# Patient Record
Sex: Female | Born: 1955 | Race: White | Hispanic: No | Smoking: Former smoker
Health system: Southern US, Community
[De-identification: ages and names within clinical notes are randomized; demographics above are authoritative.]

## PROBLEM LIST (undated history)

## (undated) DIAGNOSIS — L4050 Arthropathic psoriasis, unspecified: Secondary | ICD-10-CM

## (undated) DIAGNOSIS — T7840XA Allergy, unspecified, initial encounter: Secondary | ICD-10-CM

## (undated) DIAGNOSIS — M1990 Unspecified osteoarthritis, unspecified site: Secondary | ICD-10-CM

## (undated) DIAGNOSIS — T07XXXA Unspecified multiple injuries, initial encounter: Secondary | ICD-10-CM

## (undated) DIAGNOSIS — S82892A Other fracture of left lower leg, initial encounter for closed fracture: Secondary | ICD-10-CM

## (undated) HISTORY — PX: REDUCTION MAMMAPLASTY: SUR839

## (undated) HISTORY — DX: Unspecified osteoarthritis, unspecified site: M19.90

## (undated) HISTORY — DX: Allergy, unspecified, initial encounter: T78.40XA

## (undated) HISTORY — DX: Unspecified multiple injuries, initial encounter: T07.XXXA

## (undated) HISTORY — DX: Arthropathic psoriasis, unspecified: L40.50

## (undated) HISTORY — PX: CHOLECYSTECTOMY: SHX55

## (undated) HISTORY — PX: ABDOMINAL HERNIA REPAIR: SHX539

## (undated) HISTORY — PX: ABDOMINAL HYSTERECTOMY: SHX81

## (undated) HISTORY — PX: HYSTERECTOMY ABDOMINAL WITH SALPINGECTOMY: SHX6725

---

## 1988-07-29 HISTORY — PX: REDUCTION MAMMAPLASTY: SUR839

## 2003-07-30 HISTORY — PX: BREAST BIOPSY: SHX20

## 2016-07-29 DIAGNOSIS — S82892A Other fracture of left lower leg, initial encounter for closed fracture: Secondary | ICD-10-CM

## 2016-07-29 HISTORY — DX: Other fracture of left lower leg, initial encounter for closed fracture: S82.892A

## 2017-04-01 ENCOUNTER — Telehealth: Payer: Self-pay | Admitting: Orthopaedic Surgery

## 2017-04-01 NOTE — Telephone Encounter (Signed)
Patient called earlier today, 04/01/17, to request appointment for as soon as possible, for problem of "fracture of distal fibula", and relays [facility] [place], [place], near [place]. Discussed the need for films and reports. States has copy of films (CD). States [phone] / FAX [phone]. States was told if our office faxed a request for the reports on our letterhead, marked "Urgent", they would send.  Faxed accordingly.   Patient also relayed she is self-pay; aware of minimum payment and self-pay protocol.  States "no problem" -  Appointment pending.

## 2017-04-02 ENCOUNTER — Encounter: Payer: Self-pay | Admitting: Orthopaedic Surgery

## 2017-04-02 ENCOUNTER — Ambulatory Visit (INDEPENDENT_AMBULATORY_CARE_PROVIDER_SITE_OTHER): Payer: Self-pay | Admitting: Orthopaedic Surgery

## 2017-04-02 ENCOUNTER — Ambulatory Visit (INDEPENDENT_AMBULATORY_CARE_PROVIDER_SITE_OTHER): Payer: Self-pay

## 2017-04-02 VITALS — BP 115/79 | HR 72 | Temp 98.2°F | Ht 64.0 in

## 2017-04-02 DIAGNOSIS — S8262XA Displaced fracture of lateral malleolus of left fibula, initial encounter for closed fracture: Secondary | ICD-10-CM

## 2017-04-02 NOTE — Telephone Encounter (Signed)
Called back to patient this morning to notify that records and reports have been received.  Appointment scheduled for today; aware.

## 2017-04-02 NOTE — Progress Notes (Signed)
Subjective:    Patient ID: Heather Willis, female    DOB: 1955/11/28, 61 y.o.   MRN: [id number]  HPI She took a misstep while in [place] early Saturday morning while coming down the steps.  She hurt her left ankle.  She went to the ER there and had X-rays.  They report fracture distal fibula left with some displacement.  I do not have CD or films to review.  She had no other injury. She was put in posterior splint and given crutches.  She is now back in town and seeking treatment.  Review of Systems  HENT: Negative for congestion.   Respiratory: Negative for cough and shortness of breath.   Cardiovascular: Negative for chest pain and leg swelling.  Endocrine: Negative for cold intolerance.  Musculoskeletal: Positive for arthralgias, gait problem and joint swelling.  Allergic/Immunologic: Negative for environmental allergies.   Past Medical History:  Diagnosis Date  . Arthritis   . Fractures     History reviewed. No pertinent surgical history.  No current outpatient prescriptions on file prior to visit.   No current facility-administered medications on file prior to visit.     Social History   Social History  . Marital status: Single    Spouse name: N/A  . Number of children: N/A  . Years of education: N/A   Occupational History  . Not on file.   Social History Main Topics  . Smoking status: Never Smoker  . Smokeless tobacco: Never Used  . Alcohol use Not on file  . Drug use: Unknown  . Sexual activity: Not on file   Other Topics Concern  . Not on file   Social History Narrative  . No narrative on file    Family History  Problem Relation Age of Onset  . Diabetes Sister     BP 115/79   Pulse 72   Temp 98.2 F (36.8 C)   Ht 5\' 4"  (1.626 m)       Objective:   Physical Exam  Constitutional: She is oriented to person, place, and time. She appears well-developed and well-nourished.  HENT:  Head: Normocephalic and atraumatic.  Eyes: Pupils are equal,  round, and reactive to light. Conjunctivae and EOM are normal.  Neck: Normal range of motion. Neck supple.  Cardiovascular: Normal rate, regular rhythm and intact distal pulses.   Pulmonary/Chest: Effort normal.  Abdominal: Soft.  Musculoskeletal: She exhibits tenderness (Left ankle with swelling and ecchymosis, swelling medial and lateral with more lateral pain.  NV intact. ROM decreased.  right ankle negative.).  Neurological: She is alert and oriented to person, place, and time. She displays normal reflexes. No cranial nerve deficit. She exhibits normal muscle tone. Coordination normal.  Skin: Skin is warm and dry.  Psychiatric: She has a normal mood and affect. Her behavior is normal. Judgment and thought content normal.  Vitals reviewed.    X-rays were done and reported separately.     Assessment & Plan:   Encounter Diagnosis  Name Primary?  . Closed displaced fracture of lateral malleolus of left fibula, initial encounter Yes   I have explained the findings of the X-rays and the significance of medial mortise widening.  I will have her seen at [facility] for possible consideration of surgery.  Dr. Romeo Apple is on vacation.    Posterior splint applied.  Continue the crutches, elevation and ice.  Continue Tylenol and Advil.  Call if any problem.  Precautions discussed.   Electronically Signed Darreld Mclean, MD  9/5/20182:57 PM

## 2017-04-07 ENCOUNTER — Encounter (INDEPENDENT_AMBULATORY_CARE_PROVIDER_SITE_OTHER): Payer: Self-pay | Admitting: Orthopaedic Surgery

## 2017-04-07 ENCOUNTER — Ambulatory Visit (INDEPENDENT_AMBULATORY_CARE_PROVIDER_SITE_OTHER): Payer: Self-pay | Admitting: Orthopaedic Surgery

## 2017-04-07 ENCOUNTER — Other Ambulatory Visit (INDEPENDENT_AMBULATORY_CARE_PROVIDER_SITE_OTHER): Payer: Self-pay | Admitting: Orthopaedic Surgery

## 2017-04-07 DIAGNOSIS — S8262XA Displaced fracture of lateral malleolus of left fibula, initial encounter for closed fracture: Secondary | ICD-10-CM | POA: Insufficient documentation

## 2017-04-07 HISTORY — DX: Displaced fracture of lateral malleolus of left fibula, initial encounter for closed fracture: S82.62XA

## 2017-04-07 NOTE — Progress Notes (Signed)
   Office Visit Note   Patient: Heather Willis           Date of Birth: 03/10/1956           MRN: [id number] Visit Date: 04/07/2017              Requested by: No referring provider defined for this encounter. PCP: Patient, No Pcp Per   Assessment & Plan: Visit Diagnoses:  1. Closed displaced fracture of lateral malleolus of left fibula, initial encounter     Plan: Patient has displaced lateral malleolus fracture with widening of medial clear space. Patient's injury is indicated for operative fixation. We discussed the risks benefits alternatives surgery and wished to proceed. We will plan on surgery this week. Questions encouraged and answered. Cam Walker  Follow-Up Instructions: Return for 2 week postop visit.   Orders:  No orders of the defined types were placed in this encounter.  No orders of the defined types were placed in this encounter.     Procedures: No procedures performed   Clinical Data: No additional findings.   Subjective: Chief Complaint  Patient presents with  . Left Ankle - Pain, New Patient (Initial Visit)    Patient is a healthy 61 year old female who had a mechanical about 10 days in [place]. She is referred here for further evaluation and treatment surgical evaluation. She denies any numbness and tingling. She does have swelling and bruising. Pain is improved.    Review of Systems  Constitutional: Negative.   HENT: Negative.   Eyes: Negative.   Respiratory: Negative.   Cardiovascular: Negative.   Endocrine: Negative.   Musculoskeletal: Negative.   Neurological: Negative.   Hematological: Negative.   Psychiatric/Behavioral: Negative.   All other systems reviewed and are negative.    Objective: Vital Signs: There were no vitals taken for this visit.  Physical Exam  Constitutional: She is oriented to person, place, and time. She appears well-developed and well-nourished.  HENT:  Head: Normocephalic and atraumatic.  Eyes: EOM are  normal.  Neck: Neck supple.  Pulmonary/Chest: Effort normal.  Abdominal: Soft.  Neurological: She is alert and oriented to person, place, and time.  Skin: Skin is warm. Capillary refill takes less than 2 seconds.  Psychiatric: She has a normal mood and affect. Her behavior is normal. Judgment and thought content normal.  Nursing note and vitals reviewed.   Ortho Exam Left ankle exam shows swelling and bruising as expected from the injury. The foot is warm well-perfused. Specialty Comments:  No specialty comments available.  Imaging: No results found.   PMFS History: Patient Active Problem List   Diagnosis Date Noted  . Fracture of left ankle, lateral malleolus 04/07/2017   Past Medical History:  Diagnosis Date  . Arthritis   . Fractures     Family History  Problem Relation Age of Onset  . Diabetes Sister     No past surgical history on file. Social History   Occupational History  . Not on file.   Social History Main Topics  . Smoking status: Never Smoker  . Smokeless tobacco: Never Used  . Alcohol use Not on file  . Drug use: Unknown  . Sexual activity: Not on file

## 2017-04-08 ENCOUNTER — Encounter (HOSPITAL_BASED_OUTPATIENT_CLINIC_OR_DEPARTMENT_OTHER): Payer: Self-pay | Admitting: *Deleted

## 2017-04-09 ENCOUNTER — Ambulatory Visit (HOSPITAL_BASED_OUTPATIENT_CLINIC_OR_DEPARTMENT_OTHER): Payer: Self-pay | Admitting: Anesthesiology

## 2017-04-09 ENCOUNTER — Encounter (HOSPITAL_BASED_OUTPATIENT_CLINIC_OR_DEPARTMENT_OTHER): Payer: Self-pay | Admitting: Emergency Medicine

## 2017-04-09 ENCOUNTER — Ambulatory Visit (HOSPITAL_COMMUNITY): Payer: Self-pay

## 2017-04-09 ENCOUNTER — Encounter (HOSPITAL_BASED_OUTPATIENT_CLINIC_OR_DEPARTMENT_OTHER)
Admission: RE | Disposition: A | Discharge status: Home / Self Care | Payer: Self-pay | Source: Ambulatory Visit | Attending: Orthopaedic Surgery

## 2017-04-09 ENCOUNTER — Ambulatory Visit (HOSPITAL_BASED_OUTPATIENT_CLINIC_OR_DEPARTMENT_OTHER)
Admission: RE | Admit: 2017-04-09 | Discharge: 2017-04-09 | Disposition: A | Discharge status: Home / Self Care | Payer: Self-pay | Source: Ambulatory Visit | Attending: Orthopaedic Surgery | Admitting: Orthopaedic Surgery

## 2017-04-09 DIAGNOSIS — S8262XA Displaced fracture of lateral malleolus of left fibula, initial encounter for closed fracture: Secondary | ICD-10-CM

## 2017-04-09 DIAGNOSIS — Z6836 Body mass index (BMI) 36.0-36.9, adult: Secondary | ICD-10-CM | POA: Insufficient documentation

## 2017-04-09 DIAGNOSIS — Z419 Encounter for procedure for purposes other than remedying health state, unspecified: Secondary | ICD-10-CM

## 2017-04-09 DIAGNOSIS — X58XXXA Exposure to other specified factors, initial encounter: Secondary | ICD-10-CM | POA: Insufficient documentation

## 2017-04-09 DIAGNOSIS — F17200 Nicotine dependence, unspecified, uncomplicated: Secondary | ICD-10-CM | POA: Insufficient documentation

## 2017-04-09 HISTORY — DX: Other fracture of left lower leg, initial encounter for closed fracture: S82.892A

## 2017-04-09 HISTORY — PX: ORIF ANKLE FRACTURE: SHX5408

## 2017-04-09 SURGERY — OPEN REDUCTION INTERNAL FIXATION (ORIF) ANKLE FRACTURE
Anesthesia: General | Site: Ankle | Laterality: Left

## 2017-04-09 MED ORDER — CLINDAMYCIN PHOSPHATE 900 MG/50ML IV SOLN
INTRAVENOUS | Status: AC
Start: 2017-04-09 — End: ?
  Filled 2017-04-09: qty 50

## 2017-04-09 MED ORDER — CLINDAMYCIN PHOSPHATE 900 MG/50ML IV SOLN
900.0000 mg | INTRAVENOUS | Status: AC
  Administered 2017-04-09: 900 mg via INTRAVENOUS

## 2017-04-09 MED ORDER — ZINC SULFATE 220 (50 ZN) MG PO CAPS: 220.0000 mg | ORAL_CAPSULE | Freq: Every day | ORAL | 0 refills | Status: DC

## 2017-04-09 MED ORDER — DEXAMETHASONE SODIUM PHOSPHATE 4 MG/ML IJ SOLN
INTRAMUSCULAR | Status: DC | PRN
  Administered 2017-04-09: 10 mg via INTRAVENOUS

## 2017-04-09 MED ORDER — MIDAZOLAM HCL 2 MG/2ML IJ SOLN: 0.5000 mg | Freq: Once | INTRAMUSCULAR | Status: DC | PRN

## 2017-04-09 MED ORDER — FENTANYL CITRATE (PF) 100 MCG/2ML IJ SOLN
INTRAMUSCULAR | Status: AC
  Filled 2017-04-09: qty 2

## 2017-04-09 MED ORDER — PROMETHAZINE HCL 25 MG/ML IJ SOLN: 6.2500 mg | INTRAMUSCULAR | Status: DC | PRN

## 2017-04-09 MED ORDER — SCOPOLAMINE 1 MG/3DAYS TD PT72: 1.0000 | MEDICATED_PATCH | Freq: Once | TRANSDERMAL | Status: DC | PRN

## 2017-04-09 MED ORDER — CLINDAMYCIN PHOSPHATE 900 MG/50ML IV SOLN
INTRAVENOUS | Status: AC
  Filled 2017-04-09: qty 50

## 2017-04-09 MED ORDER — FENTANYL CITRATE (PF) 100 MCG/2ML IJ SOLN
50.0000 ug | INTRAMUSCULAR | Status: AC | PRN
  Administered 2017-04-09: 25 ug via INTRAVENOUS
  Administered 2017-04-09: 50 ug via INTRAVENOUS
  Administered 2017-04-09: 25 ug via INTRAVENOUS
  Administered 2017-04-09: 100 ug via INTRAVENOUS

## 2017-04-09 MED ORDER — ONDANSETRON HCL 4 MG/2ML IJ SOLN
INTRAMUSCULAR | Status: DC | PRN
  Administered 2017-04-09: 4 mg via INTRAVENOUS

## 2017-04-09 MED ORDER — OXYCODONE-ACETAMINOPHEN 5-325 MG PO TABS: 1.0000 | ORAL_TABLET | ORAL | 0 refills | Status: DC | PRN

## 2017-04-09 MED ORDER — BUPIVACAINE-EPINEPHRINE (PF) 0.5% -1:200000 IJ SOLN
INTRAMUSCULAR | Status: DC | PRN
  Administered 2017-04-09 (×2): 25 mL via PERINEURAL

## 2017-04-09 MED ORDER — LIDOCAINE 2% (20 MG/ML) 5 ML SYRINGE
INTRAMUSCULAR | Status: DC | PRN
Start: 2017-04-09 — End: 2017-04-09
  Administered 2017-04-09: 25 mg via INTRAVENOUS

## 2017-04-09 MED ORDER — MEPERIDINE HCL 25 MG/ML IJ SOLN: 6.2500 mg | INTRAMUSCULAR | Status: DC | PRN

## 2017-04-09 MED ORDER — PROPOFOL 10 MG/ML IV BOLUS
INTRAVENOUS | Status: DC | PRN
  Administered 2017-04-09: 200 mg via INTRAVENOUS

## 2017-04-09 MED ORDER — METHOCARBAMOL 750 MG PO TABS: 750.0000 mg | ORAL_TABLET | Freq: Two times a day (BID) | ORAL | 0 refills | Status: DC | PRN

## 2017-04-09 MED ORDER — ONDANSETRON HCL 4 MG PO TABS: 4.0000 mg | ORAL_TABLET | Freq: Three times a day (TID) | ORAL | 0 refills | Status: DC | PRN

## 2017-04-09 MED ORDER — MIDAZOLAM HCL 2 MG/2ML IJ SOLN
1.0000 mg | INTRAMUSCULAR | Status: DC | PRN
  Administered 2017-04-09: 2 mg via INTRAVENOUS

## 2017-04-09 MED ORDER — ASPIRIN EC 325 MG PO TBEC: 325.0000 mg | DELAYED_RELEASE_TABLET | Freq: Two times a day (BID) | ORAL | 0 refills | Status: DC

## 2017-04-09 MED ORDER — ONDANSETRON HCL 4 MG/2ML IJ SOLN
INTRAMUSCULAR | Status: AC
  Filled 2017-04-09: qty 2

## 2017-04-09 MED ORDER — LACTATED RINGERS IV SOLN
INTRAVENOUS | Status: DC
  Administered 2017-04-09: 09:00:00 via INTRAVENOUS

## 2017-04-09 MED ORDER — MIDAZOLAM HCL 2 MG/2ML IJ SOLN
INTRAMUSCULAR | Status: AC
  Filled 2017-04-09: qty 2

## 2017-04-09 MED ORDER — LIDOCAINE 2% (20 MG/ML) 5 ML SYRINGE
INTRAMUSCULAR | Status: AC
  Filled 2017-04-09: qty 5

## 2017-04-09 MED ORDER — SENNOSIDES-DOCUSATE SODIUM 8.6-50 MG PO TABS: 1.0000 | ORAL_TABLET | Freq: Every evening | ORAL | 1 refills | Status: DC | PRN

## 2017-04-09 MED ORDER — HYDROMORPHONE HCL 1 MG/ML IJ SOLN: 0.2500 mg | INTRAMUSCULAR | Status: DC | PRN

## 2017-04-09 MED ORDER — DEXAMETHASONE SODIUM PHOSPHATE 10 MG/ML IJ SOLN
INTRAMUSCULAR | Status: AC
  Filled 2017-04-09: qty 1

## 2017-04-09 MED ORDER — CALCIUM CARBONATE-VITAMIN D 500-200 MG-UNIT PO TABS
1.0000 | ORAL_TABLET | Freq: Three times a day (TID) | ORAL | 12 refills | Status: DC
Start: 2017-04-09 — End: 2022-01-24

## 2017-04-09 SURGICAL SUPPLY — 83 items
BANDAGE ACE 4X5 VEL STRL LF (GAUZE/BANDAGES/DRESSINGS) ×2 IMPLANT
BANDAGE ACE 6X5 VEL STRL LF (GAUZE/BANDAGES/DRESSINGS) ×2 IMPLANT
BANDAGE ESMARK 6X9 LF (GAUZE/BANDAGES/DRESSINGS) ×1 IMPLANT
BLADE HEX COATED 2.75 (ELECTRODE) IMPLANT
BLADE SURG 15 STRL LF DISP TIS (BLADE) ×2 IMPLANT
BLADE SURG 15 STRL SS (BLADE) ×2
BNDG COHESIVE 6X5 TAN STRL LF (GAUZE/BANDAGES/DRESSINGS) ×2 IMPLANT
BNDG ESMARK 6X9 LF (GAUZE/BANDAGES/DRESSINGS) ×2
BRUSH SCRUB EZ PLAIN DRY (MISCELLANEOUS) ×2 IMPLANT
CANISTER SUCT 1200ML W/VALVE (MISCELLANEOUS) ×2 IMPLANT
COVER BACK TABLE 60X90IN (DRAPES) ×2 IMPLANT
COVER MAYO STAND STRL (DRAPES) ×2 IMPLANT
CUFF TOURNIQUET SINGLE 34IN LL (TOURNIQUET CUFF) ×2 IMPLANT
DECANTER SPIKE VIAL GLASS SM (MISCELLANEOUS) IMPLANT
DRAPE C-ARM 42X72 X-RAY (DRAPES) ×2 IMPLANT
DRAPE C-ARMOR (DRAPES) ×2 IMPLANT
DRAPE EXTREMITY T 121X128X90 (DRAPE) ×2 IMPLANT
DRAPE IMP U-DRAPE 54X76 (DRAPES) ×2 IMPLANT
DRAPE SURG 17X23 STRL (DRAPES) ×4 IMPLANT
DRILL 2.6X122MM WL AO SHAFT (BIT) ×2 IMPLANT
DRSG PAD ABDOMINAL 8X10 ST (GAUZE/BANDAGES/DRESSINGS) ×4 IMPLANT
DURAPREP 26ML APPLICATOR (WOUND CARE) ×2 IMPLANT
ELECT REM PT RETURN 9FT ADLT (ELECTROSURGICAL) ×2
ELECTRODE REM PT RTRN 9FT ADLT (ELECTROSURGICAL) ×1 IMPLANT
GAUZE SPONGE 4X4 12PLY STRL (GAUZE/BANDAGES/DRESSINGS) ×2 IMPLANT
GAUZE XEROFORM 1X8 LF (GAUZE/BANDAGES/DRESSINGS) ×2 IMPLANT
GLOVE BIO SURGEON STRL SZ 6.5 (GLOVE) ×4 IMPLANT
GLOVE BIOGEL PI IND STRL 7.0 (GLOVE) ×1 IMPLANT
GLOVE BIOGEL PI IND STRL 8 (GLOVE) ×1 IMPLANT
GLOVE BIOGEL PI INDICATOR 7.0 (GLOVE) ×1
GLOVE BIOGEL PI INDICATOR 8 (GLOVE) ×1
GLOVE SKINSENSE NS SZ7.5 (GLOVE) ×1
GLOVE SKINSENSE STRL SZ7.5 (GLOVE) ×1 IMPLANT
GLOVE SURG SYN 7.5  E (GLOVE) ×1
GLOVE SURG SYN 7.5 E (GLOVE) ×1 IMPLANT
GOWN STRL REIN XL XLG (GOWN DISPOSABLE) ×2 IMPLANT
GOWN STRL REUS W/ TWL LRG LVL3 (GOWN DISPOSABLE) ×1 IMPLANT
GOWN STRL REUS W/TWL LRG LVL3 (GOWN DISPOSABLE) ×1
K-WIRE ORTHOPEDIC 1.4X150L (WIRE) ×2
KWIRE ORTHOPEDIC 1.4X150L (WIRE) ×1 IMPLANT
MANIFOLD NEPTUNE II (INSTRUMENTS) ×2 IMPLANT
NEEDLE HYPO 22GX1.5 SAFETY (NEEDLE) IMPLANT
NS IRRIG 1000ML POUR BTL (IV SOLUTION) ×2 IMPLANT
PACK BASIN DAY SURGERY FS (CUSTOM PROCEDURE TRAY) ×2 IMPLANT
PAD CAST 3X4 CTTN HI CHSV (CAST SUPPLIES) IMPLANT
PAD CAST 4YDX4 CTTN HI CHSV (CAST SUPPLIES) IMPLANT
PADDING CAST COTTON 3X4 STRL (CAST SUPPLIES)
PADDING CAST COTTON 4X4 STRL (CAST SUPPLIES)
PADDING CAST COTTON 6X4 STRL (CAST SUPPLIES) IMPLANT
PADDING CAST SYN 6 (CAST SUPPLIES) ×1
PADDING CAST SYNTHETIC 4 (CAST SUPPLIES) ×1
PADDING CAST SYNTHETIC 4X4 STR (CAST SUPPLIES) ×1 IMPLANT
PADDING CAST SYNTHETIC 6X4 NS (CAST SUPPLIES) ×1 IMPLANT
PENCIL BUTTON HOLSTER BLD 10FT (ELECTRODE) ×2 IMPLANT
PLATE FIBULA 4H (Plate) ×2 IMPLANT
SCREW 3.5X10MM (Screw) ×2 IMPLANT
SCREW BONE 14MMX3.5MM (Screw) ×2 IMPLANT
SCREW BONE 18 (Screw) ×2 IMPLANT
SCREW BONE NON-LCKING 3.5X12MM (Screw) ×2 IMPLANT
SCREW LOCK 3.5X14 (Screw) ×4 IMPLANT
SCREW LOCKING 3.5X18MM (Screw) ×2 IMPLANT
SCREW NONLOCK 22MM (Screw) ×2 IMPLANT
SHEET MEDIUM DRAPE 40X70 STRL (DRAPES) ×2 IMPLANT
SLEEVE SCD COMPRESS KNEE MED (MISCELLANEOUS) ×2 IMPLANT
SPLINT FIBERGLASS 4X30 (CAST SUPPLIES) IMPLANT
SPONGE LAP 18X18 X RAY DECT (DISPOSABLE) ×2 IMPLANT
SUCTION FRAZIER HANDLE 10FR (MISCELLANEOUS)
SUCTION TUBE FRAZIER 10FR DISP (MISCELLANEOUS) IMPLANT
SUT ETHILON 3 0 PS 1 (SUTURE) IMPLANT
SUT VIC AB 0 CT1 27 (SUTURE)
SUT VIC AB 0 CT1 27XBRD ANBCTR (SUTURE) IMPLANT
SUT VIC AB 2-0 CT1 27 (SUTURE) ×1
SUT VIC AB 2-0 CT1 TAPERPNT 27 (SUTURE) ×1 IMPLANT
SUT VIC AB 3-0 SH 27 (SUTURE)
SUT VIC AB 3-0 SH 27X BRD (SUTURE) IMPLANT
SYR BULB 3OZ (MISCELLANEOUS) ×2 IMPLANT
SYR CONTROL 10ML LL (SYRINGE) IMPLANT
TOWEL OR 17X24 6PK STRL BLUE (TOWEL DISPOSABLE) ×2 IMPLANT
TOWEL OR NON WOVEN STRL DISP B (DISPOSABLE) IMPLANT
TRAY DSU PREP LF (CUSTOM PROCEDURE TRAY) ×2 IMPLANT
TUBE CONNECTING 20X1/4 (TUBING) ×2 IMPLANT
UNDERPAD 30X30 (UNDERPADS AND DIAPERS) ×2 IMPLANT
YANKAUER SUCT BULB TIP NO VENT (SUCTIONS) ×2 IMPLANT

## 2017-04-09 NOTE — Anesthesia Procedure Notes (Addendum)
Anesthesia Regional Block: Adductor canal block   Pre-Anesthetic Checklist: ,, timeout performed, Correct Patient, Correct Site, Correct Laterality, Correct Procedure, Correct Position, site marked, Risks and benefits discussed,  Surgical consent,  Pre-op evaluation,  At surgeon's request and post-op pain management  Laterality: Left and Lower  Prep: chloraprep       Needles:  Injection technique: Single-shot  Needle Type: Echogenic Needle     Needle Length: 9cm  Needle Gauge: 21     Additional Needles:   Procedures: ultrasound guided,,,,,,,,  Narrative:  Start time: 04/09/2017 9:12 AM End time: 04/09/2017 9:19 AM Injection made incrementally with aspirations every 5 mL.  Performed by: Personally  Anesthesiologist: Jean RosenthalJACKSON, Verleen Stuckey  Additional Notes: Pt identified in Holding room.  Monitors applied. Working IV access confirmed. Sterile prep L thigh.  #21ga ECHOgenic needle into adductor canal with US guidance, 25cc 0.5% Bupivacaine with 1:200k epi injected incrementally after negative test dose, good spread in canal.  Patient asymptomatic, VSS, no heme aspirated, tolerated well.  Sandford Craze Tenille Morrill, MD

## 2017-04-09 NOTE — Op Note (Signed)
   Date of Surgery: 04/09/2017  INDICATIONS: [name] is a 61 y.o.-year-old female who sustained a left ankle fracture; she was indicated for open reduction and internal fixation due to the displaced nature of the articular fracture and came to the operating room today for this procedure. The patient did consent to the procedure after discussion of the risks and benefits.  PREOPERATIVE DIAGNOSIS: left lateral malleolus ankle fracture  POSTOPERATIVE DIAGNOSIS: Same.  PROCEDURE: Open treatment of left ankle fracture with internal fixation. Lateral malleolar CPT [id number].  SURGEON: N. Glee ArvinMichael Demarkus Willis, M.D.  ASSIST: none.  ANESTHESIA:  general, regional  TOURNIQUET TIME: less than 1 hr  IV FLUIDS AND URINE: See anesthesia.  ESTIMATED BLOOD LOSS: minimal mL.  IMPLANTS: [employer] V[employer]  COMPLICATIONS: None.  DESCRIPTION OF PROCEDURE: The patient was brought to the operating room and placed supine on the operating table.  The patient had been signed prior to the procedure and this was documented. The patient had the anesthesia placed by the anesthesiologist.  A nonsterile tourniquet was placed on the upper thigh.  The prep verification and incision time-outs were performed to confirm that this was the correct patient, site, side and location. The patient had an SCD on the opposite lower extremity. The patient did receive antibiotics prior to the incision and was re-dosed during the procedure as needed at indicated intervals.  The patient had the lower extremity prepped and draped in the standard surgical fashion.  The extremity was exsanguinated using an esmarch bandage and the tourniquet was inflated to 300 mm Hg.  An incision was created over the lateral aspect of the distal fibula. Dissection was carried down to the fascia. The fascia was sharply incised in line with the incision. Subperiosteal elevation was performed. The fracture was exposed. Organized hematoma was removed. Fracture was then  reduced and clamped with a lobster claw. The fracture alignment and reduction were confirmed under x-ray. A precontoured plate was then placed in the lateral aspect of the fibula at the appropriate position. Locking and nonlocking screws were placed through the plate into the fibula using standard AO technique. Stress exam of the ankle showed a stable medial clear space. The wound was then thoroughly irrigated and closed in layer fashion using 2-0 Vicryl, 3-0 nylon. Sterile dressings were applied. Foot was immobilized in a short leg splint. Patient tolerated procedure well and no immediate competitions.  POSTOPERATIVE PLAN: [name] will remain nonweightbearing on this leg for approximately 6 weeks; [name] will return for suture removal in 2 weeks.  He will be immobilized in a short leg splint and then transitioned to a CAM walker at his first follow up appointment.  [name] will receive DVT prophylaxis based on other medications, activity level, and risk ratio of bleeding to thrombosis.  Heather ReelN. Michael Heather Acoff, MD [facility] [phone] 10:42 AM

## 2017-04-09 NOTE — Anesthesia Preprocedure Evaluation (Addendum)
Anesthesia Evaluation  Patient identified by MRN, date of birth, ID band Patient awake    Reviewed: Allergy & Precautions, NPO status , Patient's Chart, lab work & pertinent test results  History of Anesthesia Complications Negative for: history of anesthetic complications  Airway Mallampati: I  TM Distance: >3 FB Neck ROM: Full    Dental  (+) Dental Advisory Given   Pulmonary Current Smoker,    breath sounds clear to auscultation       Cardiovascular negative cardio ROS   Rhythm:Regular Rate:Normal     Neuro/Psych negative neurological ROS     GI/Hepatic negative GI ROS, Neg liver ROS,   Endo/Other  Morbid obesity  Renal/GU negative Renal ROS     Musculoskeletal  (+) Arthritis , Osteoarthritis,    Abdominal (+) + obese,   Peds  Hematology   Anesthesia Other Findings   Reproductive/Obstetrics                            Anesthesia Physical Anesthesia Plan  ASA: II  Anesthesia Plan: General   Post-op Pain Management: GA combined w/ Regional for post-op pain   Induction: Intravenous  PONV Risk Score and Plan: 2 and Ondansetron and Dexamethasone  Airway Management Planned: LMA  Additional Equipment:   Intra-op Plan:   Post-operative Plan:   Informed Consent: I have reviewed the patients History and Physical, chart, labs and discussed the procedure including the risks, benefits and alternatives for the proposed anesthesia with the patient or authorized representative who has indicated his/her understanding and acceptance.   Dental advisory given  Plan Discussed with: CRNA and Surgeon  Anesthesia Plan Comments: (Plan routine monitors, GA- LMA OK, popliteal and adductor canal blocks for post op analgesia)        Anesthesia Quick Evaluation

## 2017-04-09 NOTE — Anesthesia Procedure Notes (Signed)
Anesthesia Regional Block: Popliteal block   Pre-Anesthetic Checklist: ,, timeout performed, Correct Patient, Correct Site, Correct Laterality, Correct Procedure, Correct Position, site marked, Risks and benefits discussed,  Surgical consent,  Pre-op evaluation,  At surgeon's request and post-op pain management  Laterality: Left and Lower  Prep: chloraprep       Needles:  Injection technique: Single-shot     Needle Length: 9cm  Needle Gauge: 21     Additional Needles:   Procedures:, nerve stimulator,,,,,,,   Nerve Stimulator or Paresthesia:  Response: toe dorsiflexion, 0.48 mA, 0.1 ms,  Response: toe abduction, 0.48 mA, 0.1 ms,   Additional Responses:   Narrative:  Start time: 04/09/2017 9:20 AM End time: 04/09/2017 9:24 AM Injection made incrementally with aspirations every 5 mL.  Performed by: Personally  Anesthesiologist: Jean RosenthalJACKSON, Imanie Darrow  Additional Notes: Pt identified in Holding room.  Monitors applied. Working IV access confirmed. Sterile prep L lateral knee.  #21ga PNS to toe abduction and dorsiflexion twitches at 0.6348mA threshold.  25cc 0.5% Bupivacaine with 1:200k epi injected incrementally after negative test dose.  Patient asymptomatic, VSS, no heme aspirated, tolerated well.  Sandford Craze Calise Dunckel, MD

## 2017-04-09 NOTE — H&P (Signed)
    PREOPERATIVE H&P  Chief Complaint: left lateral malleolus fracture  HPI: Heather Willis is a 61 y.o. female who presents for surgical treatment of left lateral malleolus fracture.  She denies any changes in medical history.  Past Medical History:  Diagnosis Date  . Arthritis   . Closed left ankle fracture 2018  . Fractures    Past Surgical History:  Procedure Laterality Date  . ABDOMINAL HERNIA REPAIR     18 yrs ago  . CHOLECYSTECTOMY    . HYSTERECTOMY ABDOMINAL WITH SALPINGECTOMY     at age 61   Social History   Social History  . Marital status: Single    Spouse name: N/A  . Number of children: N/A  . Years of education: N/A   Social History Main Topics  . Smoking status: Current Every Day Smoker  . Smokeless tobacco: Never Used  . Alcohol use No  . Drug use: No  . Sexual activity: Not on file   Other Topics Concern  . Not on file   Social History Narrative  . No narrative on file   Family History  Problem Relation Age of Onset  . Diabetes Sister    Allergies  Allergen Reactions  . Penicillins    Prior to Admission medications   Medication Sig Start Date End Date Taking? Authorizing Provider  acetaminophen (TYLENOL) 500 MG tablet Take 1,000 mg by mouth 2 (two) times daily.   Yes [provider]  diphenhydramine-acetaminophen (TYLENOL PM) 25-500 MG TABS tablet Take 2 tablets by mouth at bedtime as needed.   Yes [provider]  ibuprofen (ADVIL,MOTRIN) 200 MG tablet Take 200 mg by mouth every 6 (six) hours as needed.   Yes [provider]     Positive ROS: All other systems have been reviewed and were otherwise negative with the exception of those mentioned in the HPI and as above.  Physical Exam: General: Alert, no acute distress Cardiovascular: No pedal edema Respiratory: No cyanosis, no use of accessory musculature GI: abdomen soft Skin: No lesions in the area of chief complaint Neurologic: Sensation intact  distally Psychiatric: Patient is competent for consent with normal mood and affect Lymphatic: no lymphedema  MUSCULOSKELETAL: exam stable  Assessment: left lateral malleolus fracture  Plan: Plan for Procedure(s): OPEN REDUCTION INTERNAL FIXATION (ORIF) LEFT ANKLE FRACTURE  The risks benefits and alternatives were discussed with the patient including but not limited to the risks of nonoperative treatment, versus surgical intervention including infection, bleeding, nerve injury,  blood clots, cardiopulmonary complications, morbidity, mortality, among others, and they were willing to proceed.   Glee ArvinMichael Mariacristina Aday, MD   04/09/2017 8:26 AM

## 2017-04-09 NOTE — Anesthesia Postprocedure Evaluation (Signed)
Anesthesia Post Note  Patient: Heather BoastLinda Jahn  Procedure(s) Performed: Procedure(s) (LRB): OPEN REDUCTION INTERNAL FIXATION (ORIF) LEFT ANKLE FRACTURE (Left)     Patient location during evaluation: PACU Anesthesia Type: General and Regional Level of consciousness: awake and alert, patient cooperative and oriented Pain management: pain level controlled Vital Signs Assessment: post-procedure vital signs reviewed and stable Respiratory status: spontaneous breathing, nonlabored ventilation and respiratory function stable Cardiovascular status: blood pressure returned to baseline and stable Postop Assessment: no signs of nausea or vomiting Anesthetic complications: no    Last Vitals:  Vitals:   04/09/17 1048 04/09/17 1100  BP: (!) 155/80 (!) 149/84  Pulse: 94 85  Resp: 10 18  Temp: 36.4 C   SpO2: 100% 97%    Last Pain:  Vitals:   04/09/17 1100  TempSrc:   PainSc: 3                  Tres Grzywacz,E. Kenry Daubert

## 2017-04-09 NOTE — Transfer of Care (Signed)
Immediate Anesthesia Transfer of Care Note  Patient: Sim BoastLinda Harold  Procedure(s) Performed: Procedure(s): OPEN REDUCTION INTERNAL FIXATION (ORIF) LEFT ANKLE FRACTURE (Left)  Patient Location: PACU  Anesthesia Type:GA combined with regional for post-op pain  Level of Consciousness: awake, sedated and patient cooperative  Airway & Oxygen Therapy: Patient Spontanous Breathing and Patient connected to face mask oxygen  Post-op Assessment: Report given to RN and Post -op Vital signs reviewed and stable  Post vital signs: Reviewed and stable  Last Vitals:  Vitals:   04/09/17 0928 04/09/17 0929  BP:    Pulse: 73 75  Resp: 16 16  Temp:    SpO2: 99% 99%    Last Pain:  Vitals:   04/09/17 0851  TempSrc: Oral  PainSc: 3          Complications: No apparent anesthesia complications

## 2017-04-09 NOTE — Discharge Instructions (Signed)
    1. Keep splint clean and dry 2. Elevate foot above level of the heart 3. Take aspirin to prevent blood clots 4. Take pain meds as needed 5. Strict non weight bearing to operative extremity   Post Anesthesia Home Care Instructions  Activity: Get plenty of rest for the remainder of the day. A responsible individual must stay with you for 24 hours following the procedure.  For the next 24 hours, DO NOT: -Drive a car -Operate machinery -Drink alcoholic beverages -Take any medication unless instructed by your physician -Make any legal decisions or sign important papers.  Meals: Start with liquid foods such as gelatin or soup. Progress to regular foods as tolerated. Avoid greasy, spicy, heavy foods. If nausea and/or vomiting occur, drink only clear liquids until the nausea and/or vomiting subsides. Call your physician if vomiting continues.  Special Instructions/Symptoms: Your throat may feel dry or sore from the anesthesia or the breathing tube placed in your throat during surgery. If this causes discomfort, gargle with warm salt water. The discomfort should disappear within 24 hours.  If you had a scopolamine patch placed behind your ear for the management of post- operative nausea and/or vomiting:  1. The medication in the patch is effective for 72 hours, after which it should be removed.  Wrap patch in a tissue and discard in the trash. Wash hands thoroughly with soap and water. 2. You may remove the patch earlier than 72 hours if you experience unpleasant side effects which may include dry mouth, dizziness or visual disturbances. 3. Avoid touching the patch. Wash your hands with soap and water after contact with the patch.  Regional Anesthesia Blocks  1. Numbness or the inability to move the "blocked" extremity may last from 3-48 hours after placement. The length of time depends on the medication injected and your individual response to the medication. If the numbness is not  going away after 48 hours, call your surgeon.  2. The extremity that is blocked will need to be protected until the numbness is gone and the  Strength has returned. Because you cannot feel it, you will need to take extra care to avoid injury. Because it may be weak, you may have difficulty moving it or using it. You may not know what position it is in without looking at it while the block is in effect.  3. For blocks in the legs and feet, returning to weight bearing and walking needs to be done carefully. You will need to wait until the numbness is entirely gone and the strength has returned. You should be able to move your leg and foot normally before you try and bear weight or walk. You will need someone to be with you when you first try to ensure you do not fall and possibly risk injury.  4. Bruising and tenderness at the needle site are common side effects and will resolve in a few days.  5. Persistent numbness or new problems with movement should be communicated to the surgeon or the [facility] ([phone])/ [facility] ([phone]).      

## 2017-04-09 NOTE — Progress Notes (Signed)
Assisted Dr. Jairo Benarswell Jackson with left, ultrasound guided, popliteal/saphenous, adductor canal block. Side rails up, monitors on throughout procedure. See vital signs in flow sheet. Tolerated Procedure well.

## 2017-04-09 NOTE — Anesthesia Procedure Notes (Signed)
Procedure Name: LMA Insertion Date/Time: 04/09/2017 9:42 AM Performed by: Gar GibbonKEETON, Xion Debruyne S Pre-anesthesia Checklist: Patient identified, Emergency Drugs available, Suction available and Patient being monitored Patient Re-evaluated:Patient Re-evaluated prior to induction Oxygen Delivery Method: Circle system utilized Preoxygenation: Pre-oxygenation with 100% oxygen Induction Type: IV induction Ventilation: Mask ventilation without difficulty LMA: LMA inserted LMA Size: 4.0 Number of attempts: 1 Airway Equipment and Method: Bite block Placement Confirmation: positive ETCO2 Tube secured with: Tape Dental Injury: Teeth and Oropharynx as per pre-operative assessment

## 2017-04-10 ENCOUNTER — Encounter (HOSPITAL_BASED_OUTPATIENT_CLINIC_OR_DEPARTMENT_OTHER): Payer: Self-pay | Admitting: Orthopaedic Surgery

## 2017-04-21 ENCOUNTER — Telehealth (INDEPENDENT_AMBULATORY_CARE_PROVIDER_SITE_OTHER): Payer: Self-pay

## 2017-04-21 NOTE — Telephone Encounter (Signed)
Please advise 

## 2017-04-21 NOTE — Telephone Encounter (Signed)
Patient called stated she had surgery about 1 week ago for her ankle. She did take oxycodone for 2 days but since has made the choice not to take it for pain. She manages ok during the day but at night her pain gets worse. She currently is taking 1 advil 1 tylenol TID but would like to know what is the max amount of these is recommended for her to take during 24 hr period. She also is taking 2 aspirin daily. Contact for patient is [phone]. Please call her to advise. Thanks.

## 2017-04-21 NOTE — Telephone Encounter (Signed)
She can try tramadol which i'm happy to prescribe

## 2017-04-22 NOTE — Telephone Encounter (Signed)
Called patient and states she doesn't want Rx and will hold off until her next appt Thursday so that she can talk to Dr Roda Shutters

## 2017-04-24 ENCOUNTER — Ambulatory Visit (INDEPENDENT_AMBULATORY_CARE_PROVIDER_SITE_OTHER): Payer: Self-pay

## 2017-04-24 ENCOUNTER — Ambulatory Visit (INDEPENDENT_AMBULATORY_CARE_PROVIDER_SITE_OTHER): Payer: Self-pay | Admitting: Orthopaedic Surgery

## 2017-04-24 ENCOUNTER — Encounter (INDEPENDENT_AMBULATORY_CARE_PROVIDER_SITE_OTHER): Payer: Self-pay | Admitting: Orthopaedic Surgery

## 2017-04-24 DIAGNOSIS — S8262XD Displaced fracture of lateral malleolus of left fibula, subsequent encounter for closed fracture with routine healing: Secondary | ICD-10-CM

## 2017-04-24 MED ORDER — TRAMADOL HCL 50 MG PO TABS: 50.0000 mg | ORAL_TABLET | Freq: Four times a day (QID) | ORAL | 3 refills | Status: DC | PRN

## 2017-04-24 NOTE — Progress Notes (Signed)
Heather Willis is 2 weeks status post ORIF left lateral malleolus fracture. She is overall doing well. She is ambulating with a knee scooter. Pain is well-controlled.  Incision is healed without signs of infection. She has expected postoperative swelling. Foot is warm well-perfused.  Sutures were removed today. Continue nonweightbearing with cam walker for 2 more weeks. Follow-up in 2 weeks with repeat 3 view x-rays of left ankle. Anticipate letting her weight-bear in a Cam Walker at that time with physical therapy. She is planning on going back to [place] for physical therapy.

## 2017-04-30 ENCOUNTER — Ambulatory Visit (INDEPENDENT_AMBULATORY_CARE_PROVIDER_SITE_OTHER): Payer: 59 | Admitting: Family Medicine

## 2017-04-30 ENCOUNTER — Encounter: Payer: Self-pay | Admitting: Family Medicine

## 2017-04-30 VITALS — BP 130/76 | HR 76 | Resp 18 | Ht 64.0 in | Wt 215.0 lb

## 2017-04-30 DIAGNOSIS — Z23 Encounter for immunization: Secondary | ICD-10-CM

## 2017-04-30 DIAGNOSIS — N898 Other specified noninflammatory disorders of vagina: Secondary | ICD-10-CM

## 2017-04-30 DIAGNOSIS — R350 Frequency of micturition: Secondary | ICD-10-CM | POA: Diagnosis not present

## 2017-04-30 DIAGNOSIS — M810 Age-related osteoporosis without current pathological fracture: Secondary | ICD-10-CM

## 2017-04-30 DIAGNOSIS — Z1239 Encounter for other screening for malignant neoplasm of breast: Secondary | ICD-10-CM

## 2017-04-30 DIAGNOSIS — Z1231 Encounter for screening mammogram for malignant neoplasm of breast: Secondary | ICD-10-CM

## 2017-04-30 NOTE — Progress Notes (Signed)
Patient ID: Heather Willis, female    DOB: Nov 14, 1955, 61 y.o.   MRN: [id number]  Chief Complaint  Patient presents with  . Establish Care    new patient    Allergies Penicillins  Subjective:   Heather Willis is a 61 y.o. female who presents to [facility] today.  HPI Here to establish care. Reports that has been urinating more frequently for the past several weeks. Reports that has had some vaginal discharge for about  A month and then started having urinary frequency. Feels like has to use the bathroom all the time. Does not have dysuria or burning. No blood with urination. Has had UTI in the past but it was when she was younger. Has had a TAH BSO at age 59 and was told that she had some scarring due to untreated STD, but no problems since then. She is not sexually active and has not been sexually active for over three years. Reports that her husband committed suicide three years ago.   Lives in [place] and comes to [place] two out of the four weeks of the month b/c her parents and sister live here. 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 Reports that business is doing well. Reports taht she has not see a doctor in many years b/c did not have the money to go. Would like to follow back up and get a CPE. Has recently had a fracture of fibula when she fell down a step in the dark. Reports that has been told she has osteoporosis in the past. Had surgery for this fracture.      Vaginal Discharge  The patient's primary symptoms include a genital odor and vaginal discharge. The patient's pertinent negatives include no genital itching, genital lesions, genital rash, pelvic pain or vaginal bleeding. This is a new problem. The current episode started 1 to 4 weeks ago. The problem occurs constantly. The problem has been unchanged. The patient is experiencing no pain. She is not pregnant. Associated symptoms include frequency and urgency. Pertinent  negatives include no abdominal pain, anorexia, back pain, chills, constipation, diarrhea, discolored urine, dysuria, fever, flank pain, hematuria, nausea, rash or vomiting. The vaginal discharge was malodorous, mucoid, watery, white, yellow and thin. There has been no bleeding. Nothing aggravates the symptoms. She has tried antifungals for the symptoms. The treatment provided no relief. She is not sexually active. She uses hysterectomy for contraception. She is postmenopausal. Her past medical history is significant for a gynecological surgery, an STD and vaginosis.    Past Medical History:  Diagnosis Date  . Arthritis   . Closed left ankle fracture 2018  . Fractures   . Psoriatic arthritis [facility])     Past Surgical History:  Procedure Laterality Date  . ABDOMINAL HERNIA REPAIR     18 yrs ago  . CHOLECYSTECTOMY    . HYSTERECTOMY ABDOMINAL WITH SALPINGECTOMY     at age 6  . ORIF ANKLE FRACTURE Left 04/09/2017   Procedure: OPEN REDUCTION INTERNAL FIXATION (ORIF) LEFT ANKLE FRACTURE;  Surgeon: Tarry Kos, MD;  Location: [facility];  Service: Orthopedics;  Laterality: Left;    Family History  Problem Relation Age of Onset  . Diabetes Sister      Social History   Social History  . Marital status: Single    Spouse name: N/A  . Number of children: N/A  . Years of education: N/A   Social History Main Topics  .  Smoking status: Current Every Day Smoker  . Smokeless tobacco: Never Used  . Alcohol use No  . Drug use: No  . Sexual activity: Not Asked   Other Topics Concern  . None   Social History Narrative  . None    Review of Systems  Constitutional: Negative for chills and fever.  Gastrointestinal: Negative for abdominal pain, anorexia, constipation, diarrhea, nausea and vomiting.  Genitourinary: Positive for frequency, urgency and vaginal discharge. Negative for dysuria, flank pain, hematuria and pelvic pain.  Musculoskeletal: Negative for back pain.    Skin: Negative for rash.     Objective:   BP 130/76   Pulse 76   Resp 18   Ht  (1.626 m)   Wt 215 lb (97.5 kg)   SpO2 97%   BMI 36.90 kg/m   Physical Exam  Constitutional: She is oriented to person, place, and time. She appears well-developed and well-nourished. No distress.  Patient in a walking boot with a scooter b/c unable to bear weight on her leg.   HENT:  Head: Normocephalic and atraumatic.  Eyes: Pupils are equal, round, and reactive to light.  Neck: Normal range of motion. Neck supple. No thyromegaly present.  Cardiovascular: Normal rate, regular rhythm and normal heart sounds.   Pulmonary/Chest: Effort normal and breath sounds normal. No respiratory distress.  Abdominal: Soft. Bowel sounds are normal. She exhibits no distension and no mass. There is no tenderness. There is no guarding.  NO suprapubic TTP.   Neurological: She is alert and oriented to person, place, and time. No cranial nerve deficit.  Skin: Skin is warm and dry.  Nursing note and vitals reviewed.  Patient defers pelvic exam today.   Assessment and Plan  1. Flu vaccine need Done. Request immunization records.  - Flu Vaccine QUAD 6+ mos PF IM (Fluarix Quad PF)  2. Urinary frequency Check labs. FH of diabetes so rule out due to hyperglycemia. Check for infection.  - Basic metabolic panel - Urinalysis, Routine w reflex microscopic - Urine Microscopic - Urine Culture  3. Vaginal discharge Suspect BV. Since no skin irritation or itching. Will treat accordingly.  - Urine cytology ancillary only  4. Screening for breast cancer  - MM Digital Screening; Future  5. Osteoporosis, post-menopausal Continue vitamin D and calcium supplementation. Bring to visit.  - DG Bone Density; Future   Return in about 4 weeks (around 05/28/2017) for CPE. Aliene Beams, MD 04/30/2017

## 2017-05-01 LAB — BASIC METABOLIC PANEL
BUN: 13 mg/dL (ref 7–25)
CHLORIDE: 102 mmol/L (ref 98–110)
CO2: 29 mmol/L (ref 20–32)
Calcium: 9.8 mg/dL (ref 8.6–10.4)
Creat: 0.7 mg/dL (ref 0.50–0.99)
Glucose, Bld: 101 mg/dL (ref 65–139)
Potassium: 4 mmol/L (ref 3.5–5.3)
Sodium: 139 mmol/L (ref 135–146)

## 2017-05-01 LAB — URINALYSIS, COMPLETE
BACTERIA UA: NONE SEEN /HPF
Bilirubin Urine: NEGATIVE
Glucose, UA: NEGATIVE
HGB URINE DIPSTICK: NEGATIVE
Hyaline Cast: NONE SEEN /LPF
KETONES UR: NEGATIVE
LEUKOCYTES UA: NEGATIVE
Nitrite: NEGATIVE
PH: 7 (ref 5.0–8.0)
PROTEIN: NEGATIVE
SQUAMOUS EPITHELIAL / LPF: NONE SEEN /HPF (ref <=5)
Specific Gravity, Urine: 1.018 (ref 1.001–1.03)
WBC UA: NONE SEEN /HPF (ref 0–5)

## 2017-05-01 LAB — URINE CULTURE
MICRO NUMBER: 81099268
SPECIMEN QUALITY:: ADEQUATE

## 2017-05-05 ENCOUNTER — Telehealth: Payer: Self-pay | Admitting: Family Medicine

## 2017-05-05 NOTE — Telephone Encounter (Signed)
Please advise that I was going to call her a script in once I had the vaginal testing back. I have still not received it. Please check on the status of testing for BV, trich, yeast, and GC/chlamydia.  Please tell her that the urine test did not reveal a urinary tract infection, so I suspect that her symptoms are related to vaginal infection and urethritis. I will let her know as soon as I get it back. Please ask if her symtpoms are the same.  Tell her I did not forget about her. :) Kaiel Weide H. Tracie Harrier, MD

## 2017-05-05 NOTE — Telephone Encounter (Signed)
Patient left message on voice mail this morning @ 9:09 stating she had an appointment with Dr. Tracie Harrier and the doctor  was to call in her Rx but she says that this has not been done.  Also patient would like to know the results of her test.  The call back number that patient left on voice mail  Is  [phone] and this is the contact number in the chart, however when calling this number a recording says this number does not accept incoming calls.

## 2017-05-05 NOTE — Telephone Encounter (Signed)
Please advise on medication and labs

## 2017-05-06 NOTE — Telephone Encounter (Signed)
Please call patient and explain. Tell her we called to check on the status b/c we had not heart and they said it was not processed. She will need to resubmit. Please ask how she is doing. Heather Willis. Tracie Harrier, MD

## 2017-05-06 NOTE — Telephone Encounter (Signed)
She does need to resubmit it if we do not have the results. Janine Limbo. Tracie Harrier, MD

## 2017-05-06 NOTE — Telephone Encounter (Signed)
Spoke to patient. She is leaving Friday and has appt for ankle on Thursday but can resubmit today if needed. Advised on the rest of labs/medications.

## 2017-05-06 NOTE — Telephone Encounter (Signed)
I called [facility] and that lab for urine cytology was never processed. I see the order in the computer, but they states they did not receive the order. Patient will need to resubmit.

## 2017-05-06 NOTE — Telephone Encounter (Signed)
Called patient regarding message below. No answer, unable to leave message.  

## 2017-05-08 ENCOUNTER — Encounter (INDEPENDENT_AMBULATORY_CARE_PROVIDER_SITE_OTHER): Payer: Self-pay | Admitting: Orthopaedic Surgery

## 2017-05-08 ENCOUNTER — Ambulatory Visit (INDEPENDENT_AMBULATORY_CARE_PROVIDER_SITE_OTHER): Payer: 59

## 2017-05-08 ENCOUNTER — Ambulatory Visit (INDEPENDENT_AMBULATORY_CARE_PROVIDER_SITE_OTHER): Payer: 59 | Admitting: Orthopaedic Surgery

## 2017-05-08 DIAGNOSIS — S8262XD Displaced fracture of lateral malleolus of left fibula, subsequent encounter for closed fracture with routine healing: Secondary | ICD-10-CM

## 2017-05-08 NOTE — Progress Notes (Signed)
Patient is 4 weeks status post ORIF left bimalleolar malleolus fracture. She has minimal pain. She is ready to begin weightbearing. She has no real complaints.  Physical exam shows a fully healed surgical scar. There is minimal swelling. X-rays show stable fixation with evidence of progressive healing.  At this point we will allow her to weight-bear as tolerated in a Cam Walker. She will do her physical therapy in [place]. Follow-up in 4 weeks with repeat 3 view x-rays of the left ankle.

## 2017-05-08 NOTE — Telephone Encounter (Signed)
Called patient regarding message below. No answer, unable to leave message.  

## 2017-05-15 ENCOUNTER — Telehealth (INDEPENDENT_AMBULATORY_CARE_PROVIDER_SITE_OTHER): Payer: Self-pay | Admitting: Orthopaedic Surgery

## 2017-05-15 NOTE — Telephone Encounter (Signed)
WBAT, wean CAM boot as tolerated.  Ankle strengthening, gait training, proprioception, advanced per PT discretion

## 2017-05-15 NOTE — Telephone Encounter (Signed)
Knoche,Lily 05/07/2056    [facility]  Contact info [phone] Fax [phone]   1) Weight bearing restrictions  2) Protocol  3) Time line on restrictions

## 2017-05-15 NOTE — Telephone Encounter (Signed)
FAXED TO [facility]

## 2017-05-15 NOTE — Telephone Encounter (Signed)
See message below °

## 2017-06-10 ENCOUNTER — Ambulatory Visit (INDEPENDENT_AMBULATORY_CARE_PROVIDER_SITE_OTHER): Payer: 59

## 2017-06-10 ENCOUNTER — Encounter (INDEPENDENT_AMBULATORY_CARE_PROVIDER_SITE_OTHER): Payer: Self-pay | Admitting: Orthopaedic Surgery

## 2017-06-10 ENCOUNTER — Ambulatory Visit (INDEPENDENT_AMBULATORY_CARE_PROVIDER_SITE_OTHER): Payer: 59 | Admitting: Orthopaedic Surgery

## 2017-06-10 DIAGNOSIS — S8262XD Displaced fracture of lateral malleolus of left fibula, subsequent encounter for closed fracture with routine healing: Secondary | ICD-10-CM

## 2017-06-10 NOTE — Progress Notes (Signed)
Patient is 8 weeks status post ORIF left lateral malleolus fracture.  She is doing well overall.  She is progressing with physical therapy.  Her swelling has improved significantly.  Her surgical scar is fully healed.  No significant swelling that I can detect.  She has excellent range of motion of her ankle.  She has good strength.  X-rays demonstrate healing of the fracture without any complications.  At this point I will let her discontinue physical therapy on her own and transition to home exercise program.  Left ankle ASO brace was provided today.  Questions encouraged and answered.  Increase activity as tolerated.

## 2018-01-01 ENCOUNTER — Encounter: Payer: Self-pay | Admitting: Family Medicine

## 2018-01-02 ENCOUNTER — Encounter: Payer: Self-pay | Admitting: Family Medicine

## 2019-06-20 IMAGING — RF DG ANKLE COMPLETE 3+V*L*
1 series · 3 of 3 positions shown · non-contrast
Comparison: 04/02/2017

CLINICAL DATA: ORIF of distal fibular fracture

EXAM:
LEFT ANKLE COMPLETE - 3+ VIEW; DG C-ARM 61-120 MIN

[Series 1: run · 3 of 3 slices shown]
[im 1/3]
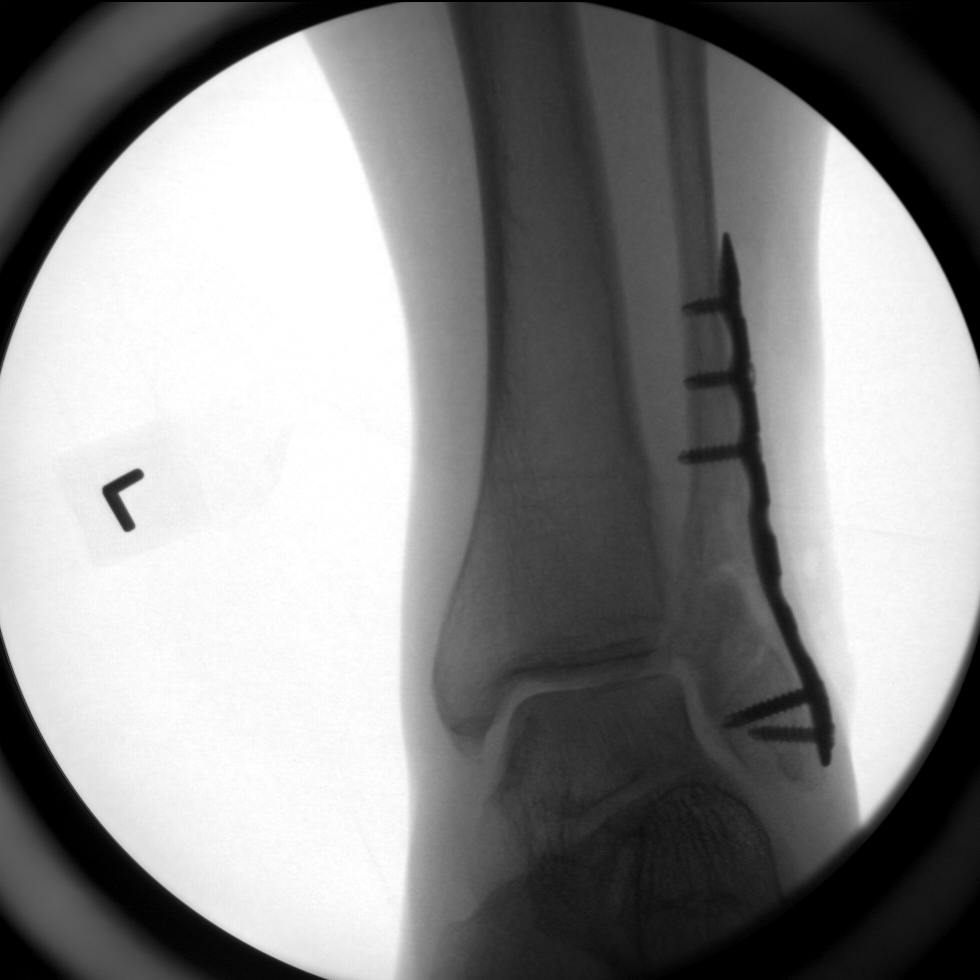
[im 2/3]
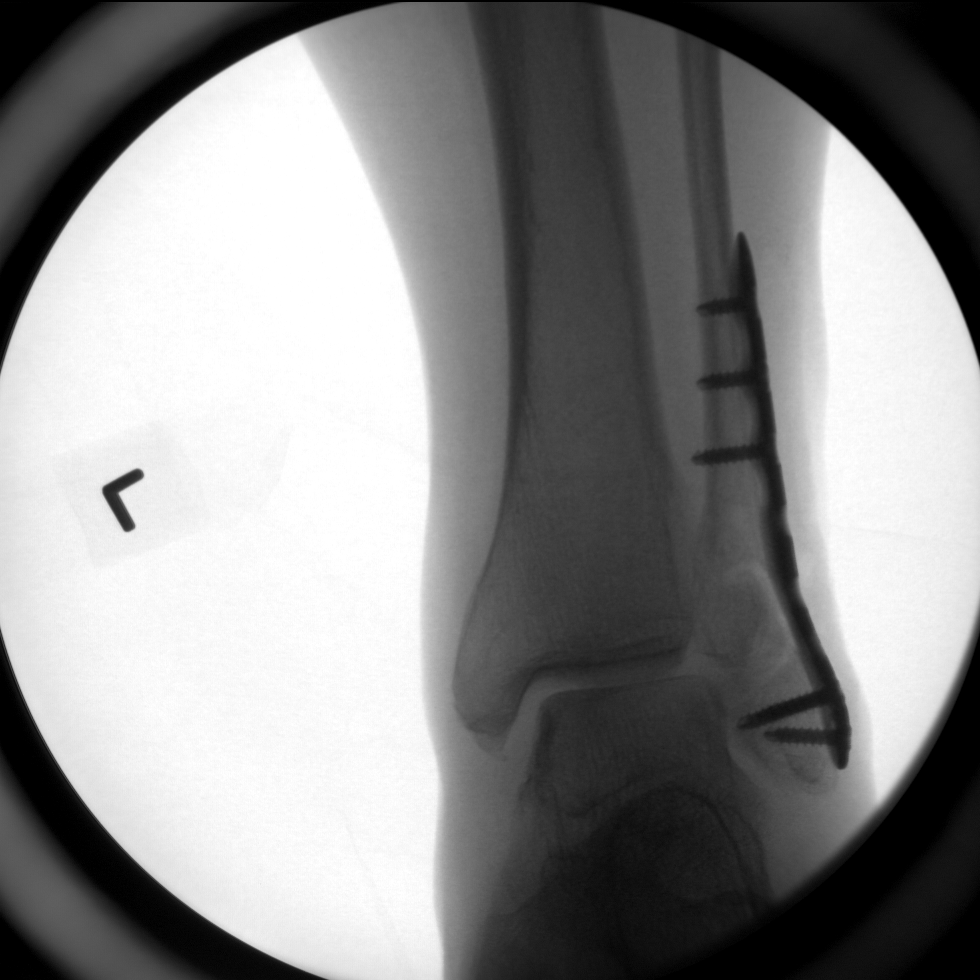
[im 3/3]
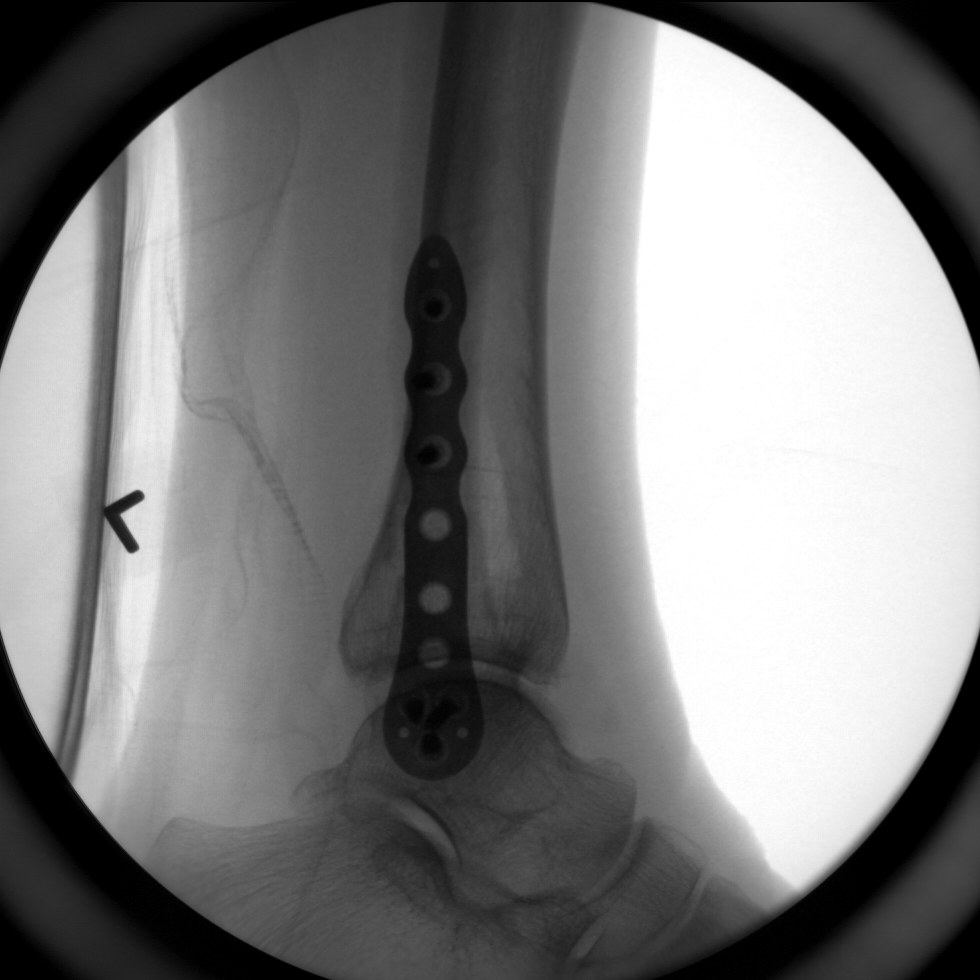

[3 of 3 positions shown; findings below may reference images not displayed]

FLUOROSCOPY TIME:  Fluoroscopy Time:  33 seconds

Radiation Exposure Index (if provided by the fluoroscopic device):
Unavailable

Number of Acquired Spot Images: 3
FINDINGS: Fixation sideplate is noted along the distal fibula with anatomic
alignment of the fracture fragments. No other focal abnormality is
noted.
IMPRESSION: ORIF of distal left fibular fracture

## 2021-09-19 ENCOUNTER — Encounter: Payer: 59 | Admitting: Internal Medicine

## 2021-11-19 DIAGNOSIS — H5203 Hypermetropia, bilateral: Secondary | ICD-10-CM | POA: Diagnosis not present

## 2022-01-24 ENCOUNTER — Encounter: Payer: Self-pay | Admitting: Nurse Practitioner

## 2022-01-24 ENCOUNTER — Ambulatory Visit (INDEPENDENT_AMBULATORY_CARE_PROVIDER_SITE_OTHER): Payer: Medicare Other | Admitting: Nurse Practitioner

## 2022-01-24 VITALS — BP 137/87 | HR 70 | Ht 64.0 in | Wt 210.0 lb

## 2022-01-24 DIAGNOSIS — M79672 Pain in left foot: Secondary | ICD-10-CM

## 2022-01-24 DIAGNOSIS — Z1211 Encounter for screening for malignant neoplasm of colon: Secondary | ICD-10-CM | POA: Diagnosis not present

## 2022-01-24 DIAGNOSIS — Z780 Asymptomatic menopausal state: Secondary | ICD-10-CM

## 2022-01-24 DIAGNOSIS — E669 Obesity, unspecified: Secondary | ICD-10-CM | POA: Insufficient documentation

## 2022-01-24 DIAGNOSIS — Z1231 Encounter for screening mammogram for malignant neoplasm of breast: Secondary | ICD-10-CM | POA: Insufficient documentation

## 2022-01-24 DIAGNOSIS — M25572 Pain in left ankle and joints of left foot: Secondary | ICD-10-CM | POA: Diagnosis not present

## 2022-01-24 NOTE — Progress Notes (Signed)
New Patient Office Visit  Subjective    Patient ID: Heather Willis, female    DOB: Dec 12, 1955  Age: 66 y.o. MRN: [id number]  CC:  Chief Complaint  Patient presents with   New Patient (Initial Visit)    np   Joint Swelling    Left ankle swelling and pain for a week around 6/22    HPI [facility] with past medical history of closed left ankle fracture, psoriatic arthritis presents to establish care. Previous PCP at [facility] in [place]. Last visit was over 2 years ago.    Patient complained of left ankle swelling and pain that started a week ago .patient stated that her pain started a day after she went hiking. In 2018 she had a closed displaced fracture of lateral malleolus of left fibular , requiring ORIF.  She currently describes her pain as constant  sharp pain 5/10, took asprin for her pain yesterday .  Patient denies numbness, tingling, fever, chills .she is able to ambulate   Due for Tdap vaccine, shingles vaccine, pneumonia vaccine.  Need for all vaccines discussed with patient patient encouraged to get vaccines at her pharmacy  Due for screening mammogram referral for screening mammogram placed today.  Referral for bone density scan placed today.  Cologuard ordered for colon cancer screening   Outpatient Encounter Medications as of 01/24/2022  Medication Sig   [DISCONTINUED] aspirin EC 325 MG tablet Take 1 tablet (325 mg total) by mouth 2 (two) times daily. (Patient not taking: Reported on 06/10/2017)   [DISCONTINUED] calcium-vitamin D (OSCAL WITH D) 500-200 MG-UNIT tablet Take 1 tablet by mouth 3 (three) times daily. (Patient not taking: Reported on 06/10/2017)   [DISCONTINUED] traMADol (ULTRAM) 50 MG tablet Take 1 tablet (50 mg total) by mouth every 6 (six) hours as needed. (Patient not taking: Reported on 06/10/2017)   [DISCONTINUED] zinc sulfate 220 (50 Zn) MG capsule Take 1 capsule (220 mg total) by mouth daily. (Patient not taking: Reported on 06/10/2017)   No  facility-administered encounter medications on file as of 01/24/2022.    Past Medical History:  Diagnosis Date   Arthritis    Closed left ankle fracture 2018   Fractures    Psoriatic arthritis ([place])     Past Surgical History:  Procedure Laterality Date   ABDOMINAL HERNIA REPAIR     18 yrs ago   CHOLECYSTECTOMY     HYSTERECTOMY ABDOMINAL WITH SALPINGECTOMY     at age 29   ORIF ANKLE FRACTURE Left 04/09/2017   Procedure: OPEN REDUCTION INTERNAL FIXATION (ORIF) LEFT ANKLE FRACTURE;  Surgeon: Leandrew Koyanagi, MD;  Location: [facility];  Service: Orthopedics;  Laterality: Left;    Family History  Problem Relation Age of Onset   Parkinsonism Mother    Throat cancer Father    Atrial fibrillation Father    Heart disease Father    Macular degeneration Father    Diabetes Sister    Atrial fibrillation Sister    Pancreatic cancer Sister    Breast cancer Neg Hx    Colon cancer Neg Hx     Social History   Socioeconomic History   Marital status: Single    Spouse name: Not on file   Number of children: 0   Years of education: Not on file   Highest education level: Not on file  Occupational History   Not on file  Tobacco Use   Smoking status: Former    Types: Cigarettes    Quit date:  1998    Years since quitting: 25.5   Smokeless tobacco: Never  Substance and Sexual Activity   Alcohol use: No   Drug use: No   Sexual activity: Not on file  Other Topics Concern   Not on file  Social History Narrative   Lives with her sister   Social Determinants of Health   Financial Resource Strain: Not on file  Food Insecurity: Not on file  Transportation Needs: Not on file  Physical Activity: Not on file  Stress: Not on file  Social Connections: Not on file  Intimate Partner Violence: Not on file    Review of Systems  Constitutional: Negative.  Negative for chills, diaphoresis, fever, malaise/fatigue and weight loss.  Respiratory: Negative.  Negative for cough,  hemoptysis, sputum production, shortness of breath and wheezing.   Cardiovascular:  Negative for chest pain, palpitations, orthopnea, claudication and leg swelling.  Gastrointestinal:  Negative for abdominal pain, heartburn, nausea and vomiting.  Musculoskeletal:  Positive for joint pain. Negative for back pain, myalgias and neck pain.  Neurological:  Negative for dizziness, tingling, tremors, sensory change, speech change, focal weakness, loss of consciousness, weakness and headaches.  Psychiatric/Behavioral: Negative.  Negative for depression, hallucinations, substance abuse and suicidal ideas. The patient is not nervous/anxious and does not have insomnia.         Objective    BP 137/87 (BP Location: Right Arm, Patient Position: Sitting, Cuff Size: Normal)   Pulse 70   Ht 5' 4"  (1.626 m)   Wt 210 lb (95.3 kg)   SpO2 95%   BMI 36.05 kg/m   Physical Exam Constitutional:      General: She is not in acute distress.    Appearance: She is obese. She is not ill-appearing, toxic-appearing or diaphoretic.  Cardiovascular:     Rate and Rhythm: Normal rate and regular rhythm.     Pulses: Normal pulses.     Heart sounds: Normal heart sounds. No murmur heard.    No friction rub. No gallop.  Pulmonary:     Effort: Pulmonary effort is normal. No respiratory distress.     Breath sounds: Normal breath sounds. No stridor. No wheezing, rhonchi or rales.  Chest:     Chest wall: No tenderness.  Musculoskeletal:        General: Tenderness present.     Right lower leg: No edema.     Left lower leg: No edema.     Comments: Has tenderness on range of motion of left foot.  Skin warm and dry no edema noted has palpable pedal pulses  Skin:    General: Skin is warm and dry.     Capillary Refill: Capillary refill takes less than 2 seconds.     Coloration: Skin is not jaundiced or pale.     Findings: No bruising, erythema, lesion or rash.  Neurological:     Mental Status: She is alert and oriented  to person, place, and time.     Cranial Nerves: No cranial nerve deficit.     Sensory: No sensory deficit.     Motor: No weakness.     Coordination: Coordination normal.     Deep Tendon Reflexes: Reflexes normal.  Psychiatric:        Mood and Affect: Mood normal.        Behavior: Behavior normal.        Thought Content: Thought content normal.        Judgment: Judgment normal.  Assessment & Plan:   Problem List Items Addressed This Visit       Other   Obesity    Wt Readings from Last 3 Encounters:  01/24/22 210 lb (95.3 kg)  04/30/17 215 lb (97.5 kg)  04/09/17 212 lb (96.2 kg)  Patient counseled on low-carb diet, encouraged to engage in regular moderate exercises as tolerated.       Post-menopausal   Relevant Orders   DG Bone Density   Encounter for screening mammogram for malignant neoplasm of breast   Relevant Orders   MM 3D SCREEN BREAST BILATERAL   Acute foot pain, left - Primary    Pain started a week ago, no swelling noted today  will check CMP today and start patient on ibuprofen as needed. Patient told to take Tylenol 650 mg every 6 hours as needed pending when her labs will result. Will refer to orthopedics if her pain does not improve. Follow-up in 4 weeks      Relevant Orders   CMP14+EGFR   Screening for colon cancer   Relevant Orders   Cologuard    Return in about 4 weeks (around 02/21/2022).   Renee Rival, FNP

## 2022-01-24 NOTE — Patient Instructions (Signed)
Please get your shingles vaccine, TDAP vaccine and pneumonia vaccines at your pharmacy    It is important that you exercise regularly at least 30 minutes 5 times a week.  Think about what you will eat, plan ahead. Choose " clean, green, fresh or frozen" over canned, processed or packaged foods which are more sugary, salty and fatty. 70 to 75% of food eaten should be vegetables and fruit. Three meals at set times with snacks allowed between meals, but they must be fruit or vegetables. Aim to eat over a 12 hour period , example 7 am to 7 pm, and STOP after  your last meal of the day. Drink water,generally about 64 ounces per day, no other drink is as healthy. Fruit juice is best enjoyed in a healthy way, by EATING the fruit.  Thanks for choosing [facility], we consider it a privelige to serve you.

## 2022-01-24 NOTE — Assessment & Plan Note (Signed)
Wt Readings from Last 3 Encounters:  01/24/22 210 lb (95.3 kg)  04/30/17 215 lb (97.5 kg)  04/09/17 212 lb (96.2 kg)  Patient counseled on low-carb diet, encouraged to engage in regular moderate exercises as tolerated.

## 2022-01-24 NOTE — Assessment & Plan Note (Addendum)
Pain started a week ago, no swelling noted today  will check CMP today and start patient on ibuprofen as needed. Patient told to take Tylenol 650 mg every 6 hours as needed pending when her labs will result. Will refer to orthopedics if her pain does not improve. Follow-up in 4 weeks

## 2022-01-25 ENCOUNTER — Other Ambulatory Visit: Payer: Self-pay | Admitting: Nurse Practitioner

## 2022-01-25 DIAGNOSIS — M79672 Pain in left foot: Secondary | ICD-10-CM

## 2022-01-25 LAB — CMP14+EGFR
ALT: 15 IU/L (ref 0–32)
AST: 16 IU/L (ref 0–40)
Albumin/Globulin Ratio: 1.4 (ref 1.2–2.2)
Albumin: 4.2 g/dL (ref 3.8–4.8)
Alkaline Phosphatase: 75 IU/L (ref 44–121)
BUN/Creatinine Ratio: 19 (ref 12–28)
BUN: 16 mg/dL (ref 8–27)
Bilirubin Total: 0.3 mg/dL (ref 0.0–1.2)
CO2: 22 mmol/L (ref 20–29)
Calcium: 9.4 mg/dL (ref 8.7–10.3)
Chloride: 100 mmol/L (ref 96–106)
Creatinine, Ser: 0.83 mg/dL (ref 0.57–1.00)
Globulin, Total: 2.9 g/dL (ref 1.5–4.5)
Glucose: 91 mg/dL (ref 70–99)
Potassium: 4.2 mmol/L (ref 3.5–5.2)
Sodium: 137 mmol/L (ref 134–144)
Total Protein: 7.1 g/dL (ref 6.0–8.5)
eGFR: 78 mL/min/{1.73_m2} (ref >59)

## 2022-01-25 MED ORDER — IBUPROFEN 600 MG PO TABS: 600.0000 mg | ORAL_TABLET | Freq: Three times a day (TID) | ORAL | 0 refills | Status: DC | PRN

## 2022-01-25 NOTE — Progress Notes (Signed)
Normal kidney function is normal   .  Take ibuprofen 600 mg every 8 hours as needed for her left foot pain

## 2022-02-05 DIAGNOSIS — Z1211 Encounter for screening for malignant neoplasm of colon: Secondary | ICD-10-CM | POA: Diagnosis not present

## 2022-02-06 ENCOUNTER — Encounter (HOSPITAL_COMMUNITY): Payer: Self-pay

## 2022-02-06 ENCOUNTER — Ambulatory Visit (HOSPITAL_COMMUNITY)
Admission: RE | Admit: 2022-02-06 | Discharge: 2022-02-06 | Disposition: A | Discharge status: Home / Self Care | Payer: Medicare Other | Source: Ambulatory Visit | Attending: Nurse Practitioner | Admitting: Nurse Practitioner

## 2022-02-06 DIAGNOSIS — Z1231 Encounter for screening mammogram for malignant neoplasm of breast: Secondary | ICD-10-CM | POA: Insufficient documentation

## 2022-02-06 DIAGNOSIS — M8589 Other specified disorders of bone density and structure, multiple sites: Secondary | ICD-10-CM | POA: Diagnosis not present

## 2022-02-06 DIAGNOSIS — Z780 Asymptomatic menopausal state: Secondary | ICD-10-CM | POA: Diagnosis not present

## 2022-02-07 ENCOUNTER — Other Ambulatory Visit (HOSPITAL_COMMUNITY): Payer: Self-pay | Admitting: Nurse Practitioner

## 2022-02-07 ENCOUNTER — Other Ambulatory Visit: Payer: Self-pay | Admitting: Nurse Practitioner

## 2022-02-07 DIAGNOSIS — R921 Mammographic calcification found on diagnostic imaging of breast: Secondary | ICD-10-CM

## 2022-02-07 DIAGNOSIS — M8588 Other specified disorders of bone density and structure, other site: Secondary | ICD-10-CM

## 2022-02-07 DIAGNOSIS — R928 Other abnormal and inconclusive findings on diagnostic imaging of breast: Secondary | ICD-10-CM

## 2022-02-07 MED ORDER — CALCIUM CARB-CHOLECALCIFEROL 600-20 MG-MCG PO TABS: 600.0000 mg | ORAL_TABLET | Freq: Two times a day (BID) | ORAL | 1 refills | Status: AC

## 2022-02-07 NOTE — Progress Notes (Signed)
Oste penia. Start taking Caltrate 600+ D3  BID.

## 2022-02-12 ENCOUNTER — Ambulatory Visit (HOSPITAL_COMMUNITY)
Admission: RE | Admit: 2022-02-12 | Discharge: 2022-02-12 | Disposition: A | Discharge status: Home / Self Care | Payer: Medicare Other | Source: Ambulatory Visit | Attending: Nurse Practitioner | Admitting: Nurse Practitioner

## 2022-02-12 ENCOUNTER — Encounter (HOSPITAL_COMMUNITY): Payer: Self-pay

## 2022-02-12 DIAGNOSIS — R921 Mammographic calcification found on diagnostic imaging of breast: Secondary | ICD-10-CM | POA: Diagnosis not present

## 2022-02-12 DIAGNOSIS — R928 Other abnormal and inconclusive findings on diagnostic imaging of breast: Secondary | ICD-10-CM

## 2022-02-14 LAB — COLOGUARD: COLOGUARD: NEGATIVE

## 2022-02-19 NOTE — Progress Notes (Signed)
Cologuard test repeat in 3 years

## 2022-02-20 DIAGNOSIS — H524 Presbyopia: Secondary | ICD-10-CM | POA: Diagnosis not present

## 2022-02-21 ENCOUNTER — Encounter: Payer: Self-pay | Admitting: Nurse Practitioner

## 2022-02-21 ENCOUNTER — Ambulatory Visit (INDEPENDENT_AMBULATORY_CARE_PROVIDER_SITE_OTHER): Payer: Medicare Other | Admitting: Nurse Practitioner

## 2022-02-21 VITALS — BP 119/70 | HR 73 | Ht 64.0 in | Wt 207.0 lb

## 2022-02-21 DIAGNOSIS — R928 Other abnormal and inconclusive findings on diagnostic imaging of breast: Secondary | ICD-10-CM | POA: Diagnosis not present

## 2022-02-21 DIAGNOSIS — M8580 Other specified disorders of bone density and structure, unspecified site: Secondary | ICD-10-CM | POA: Insufficient documentation

## 2022-02-21 DIAGNOSIS — Z139 Encounter for screening, unspecified: Secondary | ICD-10-CM | POA: Diagnosis not present

## 2022-02-21 DIAGNOSIS — M85851 Other specified disorders of bone density and structure, right thigh: Secondary | ICD-10-CM

## 2022-02-21 DIAGNOSIS — M79672 Pain in left foot: Secondary | ICD-10-CM | POA: Diagnosis not present

## 2022-02-21 NOTE — Patient Instructions (Signed)
Please get your fasting labs done 3-5 days before your next visit. ° ° °It is important that you exercise regularly at least 30 minutes 5 times a week.  °Think about what you will eat, plan ahead. °Choose " clean, green, fresh or frozen" over canned, processed or packaged foods which are more sugary, salty and fatty. °70 to 75% of food eaten should be vegetables and fruit. °Three meals at set times with snacks allowed between meals, but they must be fruit or vegetables. °Aim to eat over a 12 hour period , example 7 am to 7 pm, and STOP after  your last meal of the day. °Drink water,generally about 64 ounces per day, no other drink is as healthy. Fruit juice is best enjoyed in a healthy way, by EATING the fruit. ° °Thanks for choosing [facility], we consider it a privelige to serve you.  °

## 2022-02-21 NOTE — Assessment & Plan Note (Signed)
Foot Pain is  now completely resolved

## 2022-02-21 NOTE — Progress Notes (Signed)
   Heather Willis     MRN: [id number]      DOB: 06-15-1956   HPI Heather Willis with past medical history of osteopenia, fracture of left ankle presents for follow-up for left foot pain.  Patient stated that her left foot pain is completely resolved.  SHe has started taking calcium Caltrate with D3 1 tablet twice daily for her osteopenia  She plans to get her Tdap vaccine, shingles vaccine and pneumonia vaccine soon.   Patient denies any complaints today denies any adverse reactions to current medication    ROS Denies recent fever or chills. Denies sinus pressure, nasal congestion, ear pain or sore throat. Denies chest congestion, productive cough or wheezing. Denies chest pains, palpitations and leg swelling Denies abdominal pain, nausea, vomiting,diarrhea or constipation.   Denies dysuria, frequency, hesitancy or incontinence. Denies joint pain, swelling and limitation in mobility. Denies headaches, seizures, numbness, or tingling. Denies depression, anxiety or insomnia.   PE  BP 119/70 (BP Location: Right Arm, Patient Position: Sitting, Cuff Size: Large)   Pulse 73   Ht 5\' 4"  (1.626 m)   Wt 207 lb (93.9 kg)   SpO2 94%   BMI 35.53 kg/m   Patient alert and oriented and in no cardiopulmonary distress.  HEENT: No facial asymmetry, EOMI,     Neck supple .  Chest: Clear to auscultation bilaterally.  CVS: S1, S2 no murmurs, no S3.Regular rate.  ABD: Soft non tender.   Ext: No edema  MS: Adequate ROM spine, shoulders, hips and knees.  Skin: Intact, no ulcerations or rash noted.  Psych: Good eye contact, normal affect. Memory intact not anxious or depressed appearing.  CNS: CN 2-12 intact, power,  normal throughout.no focal deficits noted.   Assessment & Plan  Acute foot pain, left Foot Pain is  now completely resolved   Abnormal mammogram Recent  tmamaogram shows  a group of coarse likely early dystrophic type calcifications in the upper slightly inner posterior  left breast measuring 0 8 cm. There is an additional smaller group of coarse likely early dystrophic calcifications in the lower central left breast spanning 0.5 cm.  Has upcoming diagnostic mammogram in 6 months. She denies any complaints today  pt stated that she has had breat reduction surgery in the past .   Osteopenia Currently on calcium Caltrate with D3 1 tablet twice daily Continue current medication

## 2022-02-21 NOTE — Assessment & Plan Note (Signed)
Currently on calcium Caltrate with D3 1 tablet twice daily Continue current medication

## 2022-02-21 NOTE — Assessment & Plan Note (Addendum)
Recent  tmamaogram shows  a group of coarse likely early dystrophic type calcifications in the upper slightly inner posterior left breast measuring 0 8 cm. There is an additional smaller group of coarse likely early dystrophic calcifications in the lower central left breast spanning 0.5 cm.  Has upcoming diagnostic mammogram in 6 months. She denies any complaints today  pt stated that she has had breat reduction surgery in the past .

## 2022-03-19 ENCOUNTER — Emergency Department (HOSPITAL_COMMUNITY)
Admission: EM | Admit: 2022-03-19 | Discharge: 2022-03-19 | Disposition: A | Discharge status: Home / Self Care | Payer: Medicare Other | Attending: Emergency Medicine | Admitting: Emergency Medicine

## 2022-03-19 ENCOUNTER — Emergency Department (HOSPITAL_COMMUNITY): Payer: Medicare Other

## 2022-03-19 ENCOUNTER — Encounter (HOSPITAL_COMMUNITY): Payer: Self-pay

## 2022-03-19 DIAGNOSIS — W19XXXA Unspecified fall, initial encounter: Secondary | ICD-10-CM

## 2022-03-19 DIAGNOSIS — S8992XA Unspecified injury of left lower leg, initial encounter: Secondary | ICD-10-CM | POA: Diagnosis present

## 2022-03-19 DIAGNOSIS — W010XXA Fall on same level from slipping, tripping and stumbling without subsequent striking against object, initial encounter: Secondary | ICD-10-CM | POA: Insufficient documentation

## 2022-03-19 DIAGNOSIS — S81812A Laceration without foreign body, left lower leg, initial encounter: Secondary | ICD-10-CM | POA: Insufficient documentation

## 2022-03-19 DIAGNOSIS — Z23 Encounter for immunization: Secondary | ICD-10-CM | POA: Insufficient documentation

## 2022-03-19 DIAGNOSIS — Y92007 Garden or yard of unspecified non-institutional (private) residence as the place of occurrence of the external cause: Secondary | ICD-10-CM | POA: Insufficient documentation

## 2022-03-19 DIAGNOSIS — M79605 Pain in left leg: Secondary | ICD-10-CM | POA: Diagnosis not present

## 2022-03-19 MED ORDER — TETANUS-DIPHTH-ACELL PERTUSSIS 5-2.5-18.5 LF-MCG/0.5 IM SUSY
0.5000 mL | PREFILLED_SYRINGE | Freq: Once | INTRAMUSCULAR | Status: AC
  Administered 2022-03-19: 0.5 mL via INTRAMUSCULAR
  Filled 2022-03-19: qty 0.5

## 2022-03-19 MED ORDER — LIDOCAINE-EPINEPHRINE (PF) 2 %-1:200000 IJ SOLN
10.0000 mL | Freq: Once | INTRAMUSCULAR | Status: AC
  Administered 2022-03-19: 10 mL
  Filled 2022-03-19: qty 20

## 2022-03-19 NOTE — Discharge Instructions (Addendum)
Your laceration required staples.  Please follow up with your primary care provider, urgent care or return to the emergency department in 7 days to have your staples removed.    Please keep the wound dry for the next 24 hours.  After that you may gently wash the area with soap and water.  Do not submerge the wound under water until the stiches are removed.    Get help right away if: You develop severe swelling around the wound. Your pain suddenly increases and is severe. You develop painful lumps near the wound or on skin anywhere else on your body. You have a red streak going away from your wound. The wound is on your hand or foot, and you cannot properly move a finger or toe. The wound is on your hand or foot, and you notice that your fingers or toes look pale or bluish.

## 2022-03-19 NOTE — ED Triage Notes (Signed)
Pt was weeding today when she fell on a branch. Pt has ~4cm lac to left shin. Unsure if stick is still in leg.   Unsure last tetanus.

## 2022-03-19 NOTE — ED Provider Notes (Signed)
[facility] EMERGENCY DEPARTMENT Provider Note   CSN: [id number] Arrival date & time: 03/19/22  1505     History  Chief Complaint  Patient presents with   Leg Injury   Laceration    Heather Willis is a 66 y.o. female with a medical history of closed left ankle fracture status post ORIF.  Presents to the emergency department with a chief complaint of fall and left lower leg laceration.  Patient reports that today approximately 35 minutes prior to arriving in the emergency department she was outside doing yard work when she tripped over a weed and landed with her left leg on a stick.  Patient reports that the stick cut into her left leg.  Patient denies hitting her head or any loss of consciousness.  Patient is on blood thinners.  Patient complains of pain to laceration site.  Patient is unsure when her last tetanus shot was.  Patient denies any neck pain, back pain, numbness, weakness, saddle anesthesia, vomiting.   Laceration Associated symptoms: no fever and no rash        Home Medications Prior to Admission medications   Medication Sig Start Date End Date Taking? Authorizing Provider  Calcium Carb-Cholecalciferol (CALTRATE 600+D3) 600-20 MG-MCG TABS Take 600 mg by mouth 2 (two) times daily. 02/07/22   Paseda, Dewaine Conger, FNP  ibuprofen (ADVIL) 600 MG tablet Take 1 tablet (600 mg total) by mouth every 8 (eight) hours as needed. Patient not taking: Reported on 02/21/2022 01/25/22   Renee Rival, FNP      Allergies    Penicillins    Review of Systems   Review of Systems  Constitutional:  Negative for chills and fever.  Eyes:  Negative for visual disturbance.  Gastrointestinal:  Negative for nausea and vomiting.  Musculoskeletal:  Positive for myalgias. Negative for back pain and neck pain.  Skin:  Positive for wound. Negative for color change, pallor and rash.  Neurological:  Negative for weakness and numbness.    Physical Exam Updated Vital Signs BP (!) 142/84    Pulse 79   Temp 97.9 F (36.6 C) (Oral)   Resp 16   Ht 5\' 4"  (1.626 m)   Wt 95 kg   SpO2 99%   BMI 35.96 kg/m  Physical Exam Vitals and nursing note reviewed.  Constitutional:      General: She is not in acute distress.    Appearance: She is not ill-appearing, toxic-appearing or diaphoretic.  HENT:     Head: Normocephalic.  Eyes:     General: No scleral icterus.       Right eye: No discharge.        Left eye: No discharge.  Cardiovascular:     Rate and Rhythm: Normal rate.  Pulmonary:     Effort: Pulmonary effort is normal.  Musculoskeletal:     Cervical back: No swelling, edema, deformity, erythema, signs of trauma, lacerations, rigidity, spasms, torticollis, tenderness, bony tenderness or crepitus. No pain with movement. Normal range of motion.     Thoracic back: No swelling, edema, deformity, signs of trauma, lacerations, spasms, tenderness or bony tenderness.     Lumbar back: No swelling, edema, deformity, signs of trauma, lacerations, spasms, tenderness or bony tenderness.     Right knee: No swelling, deformity, effusion, erythema, ecchymosis, lacerations, bony tenderness or crepitus. Normal range of motion. No tenderness. Normal alignment.     Left knee: No swelling, deformity, effusion, erythema, ecchymosis, lacerations, bony tenderness or crepitus. Normal range of motion. No  tenderness. Normal alignment.     Right lower leg: Normal.     Left lower leg: Laceration and tenderness present. No swelling, deformity or bony tenderness. No edema.     Right ankle: No swelling, deformity, ecchymosis or lacerations. No tenderness. Normal range of motion.     Left ankle: Swelling present. No deformity, ecchymosis or lacerations. No tenderness. Normal range of motion.     Right foot: Normal range of motion and normal capillary refill. No swelling, deformity, laceration, tenderness, bony tenderness or crepitus. Normal pulse.     Left foot: Normal range of motion and normal capillary  refill. No swelling, deformity, laceration, tenderness, bony tenderness or crepitus. Normal pulse.     Comments: No midline tenderness or deformity to cervical, thoracic, lumbar spine.  L-shaped laceration to anterior aspect of left lower leg.  Tenderness surrounding laceration site.  Minimal swelling to left lateral malleus.  Full range of motion to left ankle with no tenderness on exam.  Skin:    General: Skin is warm and dry.  Neurological:     General: No focal deficit present.     Mental Status: She is alert.  Psychiatric:        Behavior: Behavior is cooperative.       ED Results / Procedures / Treatments   Labs (all labs ordered are listed, but only abnormal results are displayed) Labs Reviewed - No data to display  EKG None  Radiology No results found.  Procedures .Marland KitchenLaceration Repair  Date/Time: 03/19/2022 4:29 PM  Performed by: Haskel Schroeder, PA-C Authorized by: Haskel Schroeder, PA-C   Consent:    Consent obtained:  Verbal   Consent given by:  Patient   Risks discussed:  Infection, need for additional repair, pain, poor cosmetic result and poor wound healing   Alternatives discussed:  No treatment and delayed treatment Universal protocol:    Procedure explained and questions answered to patient or proxy's satisfaction: yes     Relevant documents present and verified: yes     Test results available: yes     Imaging studies available: yes     Required blood products, implants, devices, and special equipment available: yes     Immediately prior to procedure, a time out was called: yes     Patient identity confirmed:  Verbally with patient and arm band Anesthesia:    Anesthesia method:  Local infiltration   Local anesthetic:  Lidocaine 2% WITH epi Laceration details:    Location:  Leg   Length (cm):  5 Pre-procedure details:    Preparation:  Patient was prepped and draped in usual sterile fashion and imaging obtained to evaluate for foreign  bodies Exploration:    Imaging obtained: x-ray     Imaging outcome: foreign body not noted     Wound exploration: entire depth of wound visualized     Wound extent: no foreign bodies/material noted   Treatment:    Area cleansed with:  Povidone-iodine and chlorhexidine   Irrigation solution:  Sterile saline   Irrigation volume:  Skin repair:    Repair method:  Staples   Number of staples:  8 Approximation:    Approximation:  Close Repair type:    Repair type:  Simple Post-procedure details:    Dressing:  Non-adherent dressing and sterile dressing   Procedure completion:  Tolerated well, no immediate complications     Medications Ordered in ED Medications - No data to display  ED Course/ Medical Decision Making/ A&P  Medical Decision Making Amount and/or Complexity of Data Reviewed Radiology: ordered.  Risk Prescription drug management.   Alert 66 year old female no acute distress, nontoxic-appearing.  Presents to the ED with a chief complaint of fall and laceration to left lower leg.  Information was obtained from patient.  I reviewed patient's past medical records including previous prior notes, labs, and imaging.  Patient has medical history as outlined in HPI which complicates her care.  Noncontrast head CT imaging was considered however patient is not on any blood thinners and did not hit her head.  Head is atraumatic.  X-ray imaging of left lower leg was obtained to evaluate for retained foreign bodies as well as acute osseous abnormality.  I personally viewed interpret patient's x-ray imaging.  Agree with radiology interpretation of no acute osseous findings or radiopaque foreign bodies.  Patient's tetanus shot updated.  Suture procedure as indicated above.  Patient will need to have 8 staples removed in 7 days.  Based on patient's chief complaint, I considered admission might be necessary, however after reassuring ED workup feel  patient is reasonable for discharge.  Discussed results, findings, treatment and follow up. Patient advised of return precautions. Patient verbalized understanding and agreed with plan.  Portions of this note were generated with Scientist, clinical (histocompatibility and immunogenetics). Dictation errors may occur despite best attempts at proofreading.         Final Clinical Impression(s) / ED Diagnoses Final diagnoses:  None    Rx / DC Orders ED Discharge Orders     None         Berneice Heinrich 03/19/22 1631    Lonell Grandchild, MD 03/20/22 [phone]

## 2022-03-26 ENCOUNTER — Ambulatory Visit (INDEPENDENT_AMBULATORY_CARE_PROVIDER_SITE_OTHER): Payer: Medicare Other | Admitting: Internal Medicine

## 2022-03-26 ENCOUNTER — Encounter: Payer: Self-pay | Admitting: Internal Medicine

## 2022-03-26 VITALS — BP 130/88 | HR 59 | Ht 64.0 in | Wt 210.2 lb

## 2022-03-26 DIAGNOSIS — S81802D Unspecified open wound, left lower leg, subsequent encounter: Secondary | ICD-10-CM

## 2022-03-26 HISTORY — DX: Unspecified open wound, left lower leg, subsequent encounter: S81.802D

## 2022-03-26 MED ORDER — DOXYCYCLINE HYCLATE 100 MG PO TABS: 100.0000 mg | ORAL_TABLET | Freq: Two times a day (BID) | ORAL | 0 refills | Status: AC

## 2022-03-26 NOTE — Patient Instructions (Signed)
It was a pleasure to see you today.  Thank you for giving [place] the opportunity to be involved in your care.  Below is a brief recap of your visit and next steps.  We will plan to see you again in November (11/28)  Summary 8 staples removed today I have prescribed doxycycline 100 mg twice daily x 7 days  Next steps Please let us know if the wound is getting worse or you develop systemic symptoms (fever/chills, rigors, muscle aches) or the wound is looking worse.

## 2022-03-26 NOTE — Assessment & Plan Note (Signed)
Seen today for follow-up after suffering a laceration to the anterior portion of her left lower leg.  8 staples were removed.  Purulent material was expressed from the wound.  She has no systemic symptoms.  There is some erythema surrounding the wound.  I have prescribed doxycycline 100 mg twice daily x7 days for possible wound infection.  The patient was instructed to contact our office if she develops any systemic symptoms, there is progression of surrounding erythema, or additional there is persistent purulent drainage from the wound.  She expressed understanding.

## 2022-03-26 NOTE — Progress Notes (Signed)
Established Patient Office Visit  Subjective   Patient ID: Heather Willis, female    DOB: Feb 09, 1956  Age: 66 y.o. MRN: [id number]  Chief Complaint  Patient presents with   Follow-up    ER follow up. Remove staples from lower left leg from injury on 03/19/2022.   Heather Willis is a 66 year old woman presenting today for ER follow-up.  Heather Willis suffered a laceration to her left lower extremity while working in her yard on 8/22.  Heather Willis inadvertently fell on a stick, which punctured the anterior aspect of her left lower leg.  The laceration required repair with staples after thorough irrigation and Tdap vaccination in the ED.  In the interim, Heather Willis states that her pain has improved.  Heather Willis has tried applying a topical skin cleanser around the wound.  Heather Willis believes that Heather Willis had an allergic reaction of this as Heather Willis initially had a large area of redness surrounding the wound, but states that it is improving.  Heather Willis denies systemic symptoms of fever/chills, myalgias, and rigors.  Heather Willis is here today for staple removal.  Past Medical History:  Diagnosis Date   Arthritis    Closed left ankle fracture 2018   Fractures    Psoriatic arthritis (HCC)    Past Surgical History:  Procedure Laterality Date   ABDOMINAL HERNIA REPAIR     18 yrs ago   CHOLECYSTECTOMY     HYSTERECTOMY ABDOMINAL WITH SALPINGECTOMY     at age 27   ORIF ANKLE FRACTURE Left 04/09/2017   Procedure: OPEN REDUCTION INTERNAL FIXATION (ORIF) LEFT ANKLE FRACTURE;  Surgeon: Tarry Kos, MD;  Location: [facility];  Service: Orthopedics;  Laterality: Left;   REDUCTION MAMMAPLASTY     Social History   Tobacco Use   Smoking status: Former    Types: Cigarettes    Quit date: 1998    Years since quitting: 25.6   Smokeless tobacco: Never  Substance Use Topics   Alcohol use: No   Drug use: No   Family History  Problem Relation Age of Onset   Parkinsonism Mother    Throat cancer Father    Atrial fibrillation Father    Heart  disease Father    Macular degeneration Father    Diabetes Sister    Atrial fibrillation Sister    Pancreatic cancer Sister    Breast cancer Neg Hx    Colon cancer Neg Hx    Allergies  Allergen Reactions   Penicillins    Review of Systems  Constitutional:  Negative for chills, fever and malaise/fatigue.  Skin:  Negative for itching and rash.       Left lower extremity wound     Objective:     BP 130/88   Pulse (!) 59   Ht 5\' 4"  (1.626 m)   Wt 210 lb 3.2 oz (95.3 kg)   SpO2 98%   BMI 36.08 kg/m   Physical Exam Skin:    General: Skin is warm and dry.     Findings: Erythema present.     Comments: Left lower extremity wound repaired with staples with some surrounding erythema.  Following staple removal, purulent material was expelled from the wound.      Assessment & Plan:   Problem List Items Addressed This Visit       Other   Leg wound, left, subsequent encounter - Primary    Seen today for follow-up after suffering a laceration to the anterior portion of her left lower leg.  8  staples were removed.  Purulent material was expressed from the wound.  Heather Willis has no systemic symptoms.  There is some erythema surrounding the wound.  I have prescribed doxycycline 100 mg twice daily x7 days for possible wound infection.  The patient was instructed to contact our office if Heather Willis develops any systemic symptoms, there is progression of surrounding erythema, or additional there is persistent purulent drainage from the wound.  Heather Willis expressed understanding.      Relevant Medications   doxycycline (VIBRA-TABS) 100 MG tablet    Return in about 13 weeks (around 06/25/2022).    Billie Lade, MD

## 2022-04-17 ENCOUNTER — Ambulatory Visit: Payer: Medicare Other

## 2022-04-18 ENCOUNTER — Ambulatory Visit (INDEPENDENT_AMBULATORY_CARE_PROVIDER_SITE_OTHER): Payer: Medicare Other

## 2022-04-18 DIAGNOSIS — Z0000 Encounter for general adult medical examination without abnormal findings: Secondary | ICD-10-CM

## 2022-04-18 NOTE — Patient Instructions (Signed)
  Heather Willis , Thank you for taking time to come for your Medicare Wellness Visit. I appreciate your ongoing commitment to your health goals. Please review the following plan we discussed and let me know if I can assist you in the future.   These are the goals we discussed:  Goals      Patient Stated     Lose weight build muscle.        This is a list of the screening recommended for you and due dates:  Health Maintenance  Topic Date Due   Hepatitis C Screening: USPSTF Recommendation to screen - Ages 49-79 yo.  Never done   Zoster (Shingles) Vaccine (1 of 2) Never done   Pneumonia Vaccine (1 - PCV) Never done   COVID-19 Vaccine (3 - Pfizer series) 06/21/2021   Flu Shot  02/26/2022   Mammogram  02/07/2024   Cologuard (Stool DNA test)  02/05/2025   Tetanus Vaccine  03/19/2032   DEXA scan (bone density measurement)  Completed   HPV Vaccine  Aged Out

## 2022-04-18 NOTE — Progress Notes (Signed)
Subjective:   Heather Willis is a 66 y.o. female who presents for Medicare Annual (Subsequent) preventive examination.  Review of Systems    I connected with  [name] on 04/18/22 by a audio enabled telemedicine application and verified that I am speaking with the correct person using two identifiers.  Patient Location: Home  Provider Location: Office/Clinic  I discussed the limitations of evaluation and management by telemedicine. The patient expressed understanding and agreed to proceed.        Objective:    There were no vitals filed for this visit. There is no height or weight on file to calculate BMI.     03/19/2022    3:12 PM 04/09/2017    8:42 AM  Advanced Directives  Does Patient Have a Medical Advance Directive? No No  Would patient like information on creating a medical advance directive?  No - Patient declined    Current Medications (verified) Outpatient Encounter Medications as of 04/18/2022  Medication Sig   Calcium Carb-Cholecalciferol (CALTRATE 600+D3) 600-20 MG-MCG TABS Take 600 mg by mouth 2 (two) times daily.   ibuprofen (ADVIL) 600 MG tablet Take 1 tablet (600 mg total) by mouth every 8 (eight) hours as needed. (Patient not taking: Reported on 02/21/2022)   No facility-administered encounter medications on file as of 04/18/2022.    Allergies (verified) Penicillins   History: Past Medical History:  Diagnosis Date   Arthritis    Closed left ankle fracture 2018   Fractures    Psoriatic arthritis (HCC)    Past Surgical History:  Procedure Laterality Date   ABDOMINAL HERNIA REPAIR     18 yrs ago   CHOLECYSTECTOMY     HYSTERECTOMY ABDOMINAL WITH SALPINGECTOMY     at age 21   ORIF ANKLE FRACTURE Left 04/09/2017   Procedure: OPEN REDUCTION INTERNAL FIXATION (ORIF) LEFT ANKLE FRACTURE;  Surgeon: Tarry Kos, MD;  Location: [facility];  Service: Orthopedics;  Laterality: Left;   REDUCTION MAMMAPLASTY     Family History  Problem  Relation Age of Onset   Parkinsonism Mother    Throat cancer Father    Atrial fibrillation Father    Heart disease Father    Macular degeneration Father    Diabetes Sister    Atrial fibrillation Sister    Pancreatic cancer Sister    Breast cancer Neg Hx    Colon cancer Neg Hx    Social History   Socioeconomic History   Marital status: Widowed    Spouse name: Not on file   Number of children: 0   Years of education: Not on file   Highest education level: Not on file  Occupational History   Not on file  Tobacco Use   Smoking status: Former    Types: Cigarettes    Quit date: 54    Years since quitting: 25.7   Smokeless tobacco: Never  Substance and Sexual Activity   Alcohol use: No   Drug use: No   Sexual activity: Not on file  Other Topics Concern   Not on file  Social History Narrative   Lives with her sister   Social Determinants of Health   Financial Resource Strain: Not on file  Food Insecurity: Not on file  Transportation Needs: Not on file  Physical Activity: Not on file  Stress: Not on file  Social Connections: Not on file    Tobacco Counseling Counseling given: Not Answered   Clinical Intake:  How often do you need to have someone help you when you read instructions, pamphlets, or other written materials from your doctor or pharmacy?: (P) 1 - Never  Diabetic?NO         Activities of Daily Living    04/14/2022   10:38 AM  In your present state of health, do you have any difficulty performing the following activities:  Hearing? 0  Vision? 0  Difficulty concentrating or making decisions? 0  Walking or climbing stairs? 0  Dressing or bathing? 0  Doing errands, shopping? 0  Preparing Food and eating ? N  Using the Toilet? N  In the past six months, have you accidently leaked urine? N  Do you have problems with loss of bowel control? N  Managing your Medications? N  Managing your Finances? N  Housekeeping or managing  your Housekeeping? N    Patient Care Team: Billie Lade, MD as PCP - General (Internal Medicine)  Indicate any recent Medical Services you may have received from other than [facility] providers in the past year (date may be approximate).     Assessment:   This is a routine wellness examination for [place].  Hearing/Vision screen No results found.  Dietary issues and exercise activities discussed:     Goals Addressed   None   Depression Screen    03/26/2022    2:58 PM 02/21/2022    3:44 PM 01/24/2022    4:14 PM  PHQ 2/9 Scores  PHQ - 2 Score 0 0 0    Fall Risk    04/14/2022   10:38 AM 03/26/2022    2:58 PM 02/21/2022    3:44 PM 01/24/2022    4:14 PM  Fall Risk   Falls in the past year? 1 1 0 0  Number falls in past yr: 0 0 0 0  Injury with Fall? 1 1 0 0  Risk for fall due to :  Impaired balance/gait No Fall Risks No Fall Risks  Follow up  Falls evaluation completed Falls evaluation completed Falls evaluation completed    FALL RISK PREVENTION PERTAINING TO THE HOME:  Any stairs in or around the home? No  If so, are there any without handrails? No  Home free of loose throw rugs in walkways, pet beds, electrical cords, etc? Yes  Adequate lighting in your home to reduce risk of falls? Yes   ASSISTIVE DEVICES UTILIZED TO PREVENT FALLS:  Life alert? No  Use of a cane, walker or w/c? No  Grab bars in the bathroom? Yes  Shower chair or bench in shower? No  Elevated toilet seat or a handicapped toilet? No     Immunizations Immunization History  Administered Date(s) Administered   Influenza,inj,Quad PF,6+ Mos 04/30/2017   Influenza-Unspecified 04/30/2017   Moderna SARS-COV2 Booster Vaccination 08/08/2020, 04/26/2021   PFIZER(Purple Top)SARS-COV-2 Vaccination 09/27/2019, 10/28/2019   Tdap 03/19/2022    TDAP status: Up to date  Flu Vaccine status: Due, Education has been provided regarding the importance of this vaccine. Advised may receive this vaccine at local  pharmacy or Health Dept. Aware to provide a copy of the vaccination record if obtained from local pharmacy or Health Dept. Verbalized acceptance and understanding.  Pneumococcal vaccine status: Due, Education has been provided regarding the importance of this vaccine. Advised may receive this vaccine at local pharmacy or Health Dept. Aware to provide a copy of the vaccination record if obtained from local pharmacy or Health Dept. Verbalized acceptance and understanding.  Covid-19 vaccine status:  Completed vaccines  Qualifies for Shingles Vaccine? Yes   Zostavax completed No   Shingrix Completed?: No.    Education has been provided regarding the importance of this vaccine. Patient has been advised to call insurance company to determine out of pocket expense if they have not yet received this vaccine. Advised may also receive vaccine at local pharmacy or Health Dept. Verbalized acceptance and understanding.  Screening Tests Health Maintenance  Topic Date Due   Hepatitis C Screening  Never done   Zoster Vaccines- Shingrix (1 of 2) Never done   Pneumonia Vaccine 69+ Years old (1 - PCV) Never done   COVID-19 Vaccine (3 - Pfizer series) 06/21/2021   INFLUENZA VACCINE  02/26/2022   MAMMOGRAM  02/07/2024   Fecal DNA (Cologuard)  02/05/2025   TETANUS/TDAP  03/19/2032   DEXA SCAN  Completed   HPV VACCINES  Aged Out    Health Maintenance  Health Maintenance Due  Topic Date Due   Hepatitis C Screening  Never done   Zoster Vaccines- Shingrix (1 of 2) Never done   Pneumonia Vaccine 36+ Years old (1 - PCV) Never done   COVID-19 Vaccine (3 - Pfizer series) 06/21/2021   INFLUENZA VACCINE  02/26/2022    Colorectal cancer screening: Type of screening: Cologuard. Completed 02/05/22. Repeat every 3 years  Mammogram status: Completed 02/06/22. Repeat every year  Bone Density status: Completed 02/06/22. Results reflect: Bone density results: OSTEOPENIA. Repeat every 2 years.  Lung Cancer  Screening: (Low Dose CT Chest recommended if Age 27-80 years, 30 pack-year currently smoking OR have quit w/in 15years.) does not qualify.   Lung Cancer Screening Referral: no  Additional Screening:  Hepatitis C Screening: does qualify; Completed no  Vision Screening: Recommended annual ophthalmology exams for early detection of glaucoma and other disorders of the eye. Is the patient up to date with their annual eye exam?  Yes  Who is the provider or what is the name of the office in which the patient attends annual eye exams? [facility] If pt is not established with a provider, would they like to be referred to a provider to establish care? No .   Dental Screening: Recommended annual dental exams for proper oral hygiene  Community Resource Referral / Chronic Care Management: CRR required this visit?  No   CCM required this visit?  No      Plan:     I have personally reviewed and noted the following in the patient's chart:   Medical and social history Use of alcohol, tobacco or illicit drugs  Current medications and supplements including opioid prescriptions. Patient is not currently taking opioid prescriptions. Functional ability and status Nutritional status Physical activity Advanced directives List of other physicians Hospitalizations, surgeries, and ER visits in previous 12 months Vitals Screenings to include cognitive, depression, and falls Referrals and appointments  In addition, I have reviewed and discussed with patient certain preventive protocols, quality metrics, and best practice recommendations. A written personalized care plan for preventive services as well as general preventive health recommendations were provided to patient.     Quentin Angst, [place]   04/18/2022

## 2022-06-24 ENCOUNTER — Other Ambulatory Visit: Payer: Self-pay | Admitting: Internal Medicine

## 2022-06-24 DIAGNOSIS — Z0001 Encounter for general adult medical examination with abnormal findings: Secondary | ICD-10-CM

## 2022-06-25 ENCOUNTER — Encounter: Payer: Medicare Other | Admitting: Nurse Practitioner

## 2022-06-25 ENCOUNTER — Encounter: Payer: Self-pay | Admitting: Internal Medicine

## 2022-06-25 ENCOUNTER — Ambulatory Visit (INDEPENDENT_AMBULATORY_CARE_PROVIDER_SITE_OTHER): Payer: Medicare Other | Admitting: Internal Medicine

## 2022-06-25 VITALS — BP 146/80 | HR 61 | Ht 64.0 in | Wt 199.2 lb

## 2022-06-25 DIAGNOSIS — Z0001 Encounter for general adult medical examination with abnormal findings: Secondary | ICD-10-CM

## 2022-06-25 DIAGNOSIS — E785 Hyperlipidemia, unspecified: Secondary | ICD-10-CM | POA: Diagnosis not present

## 2022-06-25 DIAGNOSIS — Z1159 Encounter for screening for other viral diseases: Secondary | ICD-10-CM | POA: Diagnosis not present

## 2022-06-25 NOTE — Progress Notes (Unsigned)
Complete physical exam  Patient: Heather Willis   DOB: 24-Nov-1955   66 y.o. Female  MRN: [id number]  Subjective:    Chief Complaint  Patient presents with   Annual Exam    CPE, eyes burning hard to read, large veins in ankles L worse than right    Heather Willis is a 66 y.o. female who presents today for a complete physical exam. She reports consuming a low fat diet. Home exercise routine includes walking 1-2 hrs per day and lifting weights at home. She generally feels well. She reports sleeping fairly well. She does have additional problems to discuss today.   Most recent fall risk assessment:    06/25/2022    2:57 PM  Fall Risk   Falls in the past year? 1  Number falls in past yr: 0  Injury with Fall? 1  Risk for fall due to : History of fall(s)  Follow up Falls evaluation completed   Most recent depression screenings:    06/25/2022    2:57 PM 04/18/2022   11:38 AM  PHQ 2/9 Scores  PHQ - 2 Score 0 1    Vision:Within last year and Dental: No current dental problems and Receives regular dental care  Past Medical History:  Diagnosis Date   Arthritis    Closed left ankle fracture 2018   Fractures    Psoriatic arthritis ([place])    Past Surgical History:  Procedure Laterality Date   ABDOMINAL HERNIA REPAIR     18 yrs ago   CHOLECYSTECTOMY     HYSTERECTOMY ABDOMINAL WITH SALPINGECTOMY     at age 42   ORIF ANKLE FRACTURE Left 04/09/2017   Procedure: OPEN REDUCTION INTERNAL FIXATION (ORIF) LEFT ANKLE FRACTURE;  Surgeon: Leandrew Koyanagi, MD;  Location: [facility];  Service: Orthopedics;  Laterality: Left;   REDUCTION MAMMAPLASTY     Social History   Tobacco Use   Smoking status: Former    Types: Cigarettes    Quit date: 1998    Years since quitting: 25.9   Smokeless tobacco: Never  Substance Use Topics   Alcohol use: No   Drug use: No   Family History  Problem Relation Age of Onset   Parkinsonism Mother    Throat cancer Father    Atrial  fibrillation Father    Heart disease Father    Macular degeneration Father    Diabetes Sister    Atrial fibrillation Sister    Pancreatic cancer Sister    Breast cancer Neg Hx    Colon cancer Neg Hx    Allergies  Allergen Reactions   Penicillins    Patient Care Team: Johnette Abraham, MD as PCP - General (Internal Medicine)   Outpatient Medications Prior to Visit  Medication Sig   Calcium Carb-Cholecalciferol (CALTRATE 600+D3) 600-20 MG-MCG TABS Take 600 mg by mouth 2 (two) times daily.   ibuprofen (ADVIL) 600 MG tablet Take 1 tablet (600 mg total) by mouth every 8 (eight) hours as needed. (Patient not taking: Reported on 02/21/2022)   No facility-administered medications prior to visit.   Review of Systems  Eyes:  Positive for blurred vision.  All other systems reviewed and are negative.     Objective:     BP (!) 146/80 (BP Location: Left Arm, Patient Position: Sitting, Cuff Size: Large)   Pulse 61   Ht _0  (1.626 m)   Wt 199 lb 3.2 oz (90.4 kg)   SpO2 98%   BMI 34.19 kg/m  BP Readings from Last 3 Encounters:  06/25/22 (!) 146/80  03/26/22 130/88  03/19/22 (!) 142/84   Physical Exam Vitals reviewed.  Constitutional:      General: She is not in acute distress.    Appearance: Normal appearance. She is obese. She is not toxic-appearing.  HENT:     Head: Normocephalic and atraumatic.     Right Ear: External ear normal.     Left Ear: External ear normal.     Nose: Nose normal. No congestion or rhinorrhea.     Mouth/Throat:     Mouth: Mucous membranes are moist.     Pharynx: Oropharynx is clear. No oropharyngeal exudate or posterior oropharyngeal erythema.  Eyes:     General: No scleral icterus.    Extraocular Movements: Extraocular movements intact.     Conjunctiva/sclera: Conjunctivae normal.     Pupils: Pupils are equal, round, and reactive to light.  Cardiovascular:     Rate and Rhythm: Normal rate and regular rhythm.     Pulses: Normal pulses.      Heart sounds: Normal heart sounds. No murmur heard.    No friction rub. No gallop.  Pulmonary:     Effort: Pulmonary effort is normal.     Breath sounds: Normal breath sounds. No wheezing, rhonchi or rales.  Abdominal:     General: Abdomen is flat. Bowel sounds are normal. There is no distension.     Palpations: Abdomen is soft.     Tenderness: There is no abdominal tenderness.  Musculoskeletal:        General: No swelling. Normal range of motion.     Cervical back: Normal range of motion.     Right lower leg: No edema.     Left lower leg: No edema.  Lymphadenopathy:     Cervical: No cervical adenopathy.  Skin:    General: Skin is warm and dry.     Capillary Refill: Capillary refill takes less than 2 seconds.     Coloration: Skin is not jaundiced.  Neurological:     General: No focal deficit present.     Mental Status: She is alert and oriented to person, place, and time.  Psychiatric:        Mood and Affect: Mood normal.        Behavior: Behavior normal.    Last CBC Lab Results  Component Value Date   WBC 6.3 06/24/2022   HGB 14.0 06/24/2022   HCT 42.4 06/24/2022   MCV 88 06/24/2022   MCH 29.1 06/24/2022   RDW 13.6 06/24/2022   PLT 310 62/94/7654   Last metabolic panel Lab Results  Component Value Date   GLUCOSE 91 06/24/2022   NA 139 06/24/2022   K 4.3 06/24/2022   CL 102 06/24/2022   CO2 21 06/24/2022   BUN 16 06/24/2022   CREATININE 0.75 06/24/2022   EGFR 87 06/24/2022   CALCIUM 9.9 06/24/2022   PROT 7.1 06/24/2022   ALBUMIN 4.0 06/24/2022   LABGLOB 3.1 06/24/2022   AGRATIO 1.3 06/24/2022   BILITOT 0.4 06/24/2022   ALKPHOS 64 06/24/2022   AST 19 06/24/2022   ALT 21 06/24/2022   Last lipids Lab Results  Component Value Date   CHOL 210 (H) 06/24/2022   HDL 60 06/24/2022   LDLCALC 131 (H) 06/24/2022   TRIG 108 06/24/2022   CHOLHDL 3.5 06/24/2022   Last hemoglobin A1c Lab Results  Component Value Date   HGBA1C 6.1 (H) 06/24/2022   Last thyroid  functions Lab  Results  Component Value Date   TSH 1.140 06/24/2022   Last vitamin D Lab Results  Component Value Date   VD25OH 36.6 06/24/2022   Last vitamin B12 and Folate Lab Results  Component Value Date   VITAMINB12 >2000 (H) 06/24/2022   FOLATE 11.7 06/24/2022        Assessment & Plan:    Routine Health Maintenance and Physical Exam  Immunization History  Administered Date(s) Administered   Influenza,inj,Quad PF,6+ Mos 04/30/2017   Influenza-Unspecified 04/30/2017   Moderna SARS-COV2 Booster Vaccination 08/08/2020, 04/26/2021   PFIZER(Purple Top)SARS-COV-2 Vaccination 09/27/2019, 10/28/2019   Tdap 03/19/2022    Health Maintenance  Topic Date Due   Zoster Vaccines- Shingrix (1 of 2) Never done   Pneumonia Vaccine 61+ Years old (1 - PCV) Never done   INFLUENZA VACCINE  02/26/2022   COVID-19 Vaccine (5 - 2023-24 season) 03/29/2022   Medicare Annual Wellness (AWV)  04/19/2023   MAMMOGRAM  02/07/2024   Fecal DNA (Cologuard)  02/05/2025   DTaP/Tdap/Td (2 - Td or Tdap) 03/19/2032   DEXA SCAN  Completed   Hepatitis C Screening  Completed   HPV VACCINES  Aged Out    Discussed health benefits of physical activity, and encouraged her to engage in regular exercise appropriate for her age and condition.  Problem List Items Addressed This Visit       Encounter for well adult exam with abnormal findings    Presenting today for her annual exam. -Labs from earlier this week were reviewed. -Vaccines are up-to-date.  These have been received at [facility].  We will update our records. -One-time HCV screening ordered today      Hyperlipidemia    Total cholesterol 210 and LDL 131.  She plans to make significant lifestyle modifications aimed at improving her cholesterol and losing weight.  We will plan for repeat lipid panel in 3 months.      Return in about 3 months (around 09/25/2022).   Johnette Abraham, MD

## 2022-06-25 NOTE — Patient Instructions (Signed)
It was a pleasure to see you today.  Thank you for giving [place] the opportunity to be involved in your care.  Below is a brief recap of your visit and next steps.  We will plan to see you again in 3 months.  Summary We completed your annual physical exam today We will plan for follow up in 3 months with repeat labs Hepatitis C screening has been ordered today

## 2022-06-26 LAB — CBC WITH DIFFERENTIAL/PLATELET
Basophils Absolute: 0 10*3/uL (ref 0.0–0.2)
Basos: 0 %
EOS (ABSOLUTE): 0.1 10*3/uL (ref 0.0–0.4)
Eos: 2 %
Hematocrit: 42.4 % (ref 34.0–46.6)
Hemoglobin: 14 g/dL (ref 11.1–15.9)
Immature Grans (Abs): 0 10*3/uL (ref 0.0–0.1)
Immature Granulocytes: 0 %
Lymphocytes Absolute: 2.8 10*3/uL (ref 0.7–3.1)
Lymphs: 44 %
MCH: 29.1 pg (ref 26.6–33.0)
MCHC: 33 g/dL (ref 31.5–35.7)
MCV: 88 fL (ref 79–97)
Monocytes Absolute: 0.4 10*3/uL (ref 0.1–0.9)
Monocytes: 6 %
Neutrophils Absolute: 3 10*3/uL (ref 1.4–7.0)
Neutrophils: 48 %
Platelets: 310 10*3/uL (ref 150–450)
RBC: 4.81 x10E6/uL (ref 3.77–5.28)
RDW: 13.6 % (ref 11.7–15.4)
WBC: 6.3 10*3/uL (ref 3.4–10.8)

## 2022-06-26 LAB — CMP14+EGFR
ALT: 21 IU/L (ref 0–32)
AST: 19 IU/L (ref 0–40)
Albumin/Globulin Ratio: 1.3 (ref 1.2–2.2)
Albumin: 4 g/dL (ref 3.9–4.9)
Alkaline Phosphatase: 64 IU/L (ref 44–121)
BUN/Creatinine Ratio: 21 (ref 12–28)
BUN: 16 mg/dL (ref 8–27)
Bilirubin Total: 0.4 mg/dL (ref 0.0–1.2)
CO2: 21 mmol/L (ref 20–29)
Calcium: 9.9 mg/dL (ref 8.7–10.3)
Chloride: 102 mmol/L (ref 96–106)
Creatinine, Ser: 0.75 mg/dL (ref 0.57–1.00)
Globulin, Total: 3.1 g/dL (ref 1.5–4.5)
Glucose: 91 mg/dL (ref 70–99)
Potassium: 4.3 mmol/L (ref 3.5–5.2)
Sodium: 139 mmol/L (ref 134–144)
Total Protein: 7.1 g/dL (ref 6.0–8.5)
eGFR: 87 mL/min/{1.73_m2} (ref >59)

## 2022-06-26 LAB — LIPID PANEL
Chol/HDL Ratio: 3.5 ratio (ref 0.0–4.4)
Cholesterol, Total: 210 mg/dL — ABNORMAL HIGH (ref 100–199)
HDL: 60 mg/dL (ref >39)
LDL Chol Calc (NIH): 131 mg/dL — ABNORMAL HIGH (ref 0–99)
Triglycerides: 108 mg/dL (ref 0–149)
VLDL Cholesterol Cal: 19 mg/dL (ref 5–40)

## 2022-06-26 LAB — HEMOGLOBIN A1C
Est. average glucose Bld gHb Est-mCnc: 128 mg/dL
Hgb A1c MFr Bld: 6.1 % — ABNORMAL HIGH (ref 4.8–5.6)

## 2022-06-26 LAB — HCV INTERPRETATION

## 2022-06-26 LAB — VITAMIN D 25 HYDROXY (VIT D DEFICIENCY, FRACTURES): Vit D, 25-Hydroxy: 36.6 ng/mL (ref 30.0–100.0)

## 2022-06-26 LAB — B12 AND FOLATE PANEL
Folate: 11.7 ng/mL (ref >3.0)
Vitamin B-12: >2000 pg/mL — ABNORMAL HIGH (ref 232–1245)

## 2022-06-26 LAB — HCV AB W REFLEX TO QUANT PCR: HCV Ab: NONREACTIVE

## 2022-06-26 LAB — TSH+FREE T4
Free T4: 1.29 ng/dL (ref 0.82–1.77)
TSH: 1.14 u[IU]/mL (ref 0.450–4.500)

## 2022-06-27 DIAGNOSIS — Z0001 Encounter for general adult medical examination with abnormal findings: Secondary | ICD-10-CM | POA: Insufficient documentation

## 2022-06-27 DIAGNOSIS — E785 Hyperlipidemia, unspecified: Secondary | ICD-10-CM | POA: Insufficient documentation

## 2022-06-27 NOTE — Assessment & Plan Note (Signed)
Total cholesterol 210 and LDL 131.  She plans to make significant lifestyle modifications aimed at improving her cholesterol and losing weight.  We will plan for repeat lipid panel in 3 months.

## 2022-06-27 NOTE — Assessment & Plan Note (Addendum)
Presenting today for her annual exam. -Labs from earlier this week were reviewed. -Vaccines are up-to-date.  These have been received at [facility].  We will update our records. -One-time HCV screening ordered today

## 2022-09-03 ENCOUNTER — Other Ambulatory Visit (HOSPITAL_COMMUNITY): Payer: Self-pay | Admitting: Internal Medicine

## 2022-09-03 DIAGNOSIS — R921 Mammographic calcification found on diagnostic imaging of breast: Secondary | ICD-10-CM

## 2022-09-23 DIAGNOSIS — Z139 Encounter for screening, unspecified: Secondary | ICD-10-CM | POA: Diagnosis not present

## 2022-09-24 ENCOUNTER — Ambulatory Visit (HOSPITAL_COMMUNITY)
Admission: RE | Admit: 2022-09-24 | Discharge: 2022-09-24 | Disposition: A | Discharge status: Home / Self Care | Payer: Medicare Other | Source: Ambulatory Visit | Attending: Internal Medicine | Admitting: Internal Medicine

## 2022-09-24 DIAGNOSIS — R921 Mammographic calcification found on diagnostic imaging of breast: Secondary | ICD-10-CM

## 2022-09-24 DIAGNOSIS — K080 Exfoliation of teeth due to systemic causes: Secondary | ICD-10-CM | POA: Diagnosis not present

## 2022-09-24 LAB — VITAMIN D 25 HYDROXY (VIT D DEFICIENCY, FRACTURES): Vit D, 25-Hydroxy: 44.6 ng/mL (ref 30.0–100.0)

## 2022-09-24 LAB — CBC WITH DIFFERENTIAL/PLATELET
Basos: 0 %
Hematocrit: 41.5 % (ref 34.0–46.6)
Hemoglobin: 13.6 g/dL (ref 11.1–15.9)
Immature Grans (Abs): 0 10*3/uL (ref 0.0–0.1)
MCH: 29.7 pg (ref 26.6–33.0)
Monocytes Absolute: 0.4 10*3/uL (ref 0.1–0.9)
Platelets: 296 10*3/uL (ref 150–450)
WBC: 6.8 10*3/uL (ref 3.4–10.8)

## 2022-09-24 LAB — LIPID PANEL
Chol/HDL Ratio: 3.2 ratio (ref 0.0–4.4)
Cholesterol, Total: 209 mg/dL — ABNORMAL HIGH (ref 100–199)
HDL: 65 mg/dL (ref >39)
Triglycerides: 53 mg/dL (ref 0–149)

## 2022-09-24 LAB — HEPATITIS C ANTIBODY

## 2022-09-24 LAB — HEMOGLOBIN A1C: Hgb A1c MFr Bld: 6.2 % — ABNORMAL HIGH (ref 4.8–5.6)

## 2022-09-25 ENCOUNTER — Ambulatory Visit (INDEPENDENT_AMBULATORY_CARE_PROVIDER_SITE_OTHER): Payer: Medicare Other | Admitting: Internal Medicine

## 2022-09-25 ENCOUNTER — Encounter: Payer: Self-pay | Admitting: Internal Medicine

## 2022-09-25 VITALS — BP 108/71 | HR 64 | Ht 64.0 in | Wt 188.0 lb

## 2022-09-25 DIAGNOSIS — E782 Mixed hyperlipidemia: Secondary | ICD-10-CM

## 2022-09-25 DIAGNOSIS — R7303 Prediabetes: Secondary | ICD-10-CM | POA: Insufficient documentation

## 2022-09-25 LAB — CBC WITH DIFFERENTIAL/PLATELET
Basophils Absolute: 0 10*3/uL (ref 0.0–0.2)
EOS (ABSOLUTE): 0.2 10*3/uL (ref 0.0–0.4)
Eos: 3 %
Immature Granulocytes: 0 %
Lymphocytes Absolute: 2.4 10*3/uL (ref 0.7–3.1)
Lymphs: 36 %
MCHC: 32.8 g/dL (ref 31.5–35.7)
MCV: 91 fL (ref 79–97)
Monocytes: 6 %
Neutrophils Absolute: 3.8 10*3/uL (ref 1.4–7.0)
Neutrophils: 55 %
RBC: 4.58 x10E6/uL (ref 3.77–5.28)
RDW: 13.3 % (ref 11.7–15.4)

## 2022-09-25 LAB — HEMOGLOBIN A1C: Est. average glucose Bld gHb Est-mCnc: 131 mg/dL

## 2022-09-25 LAB — LIPID PANEL
LDL Chol Calc (NIH): 134 mg/dL — ABNORMAL HIGH (ref 0–99)
VLDL Cholesterol Cal: 10 mg/dL (ref 5–40)

## 2022-09-25 LAB — TSH: TSH: 1.39 u[IU]/mL (ref 0.450–4.500)

## 2022-09-25 NOTE — Patient Instructions (Signed)
It was a pleasure to see you today.  Thank you for giving [place] the opportunity to be involved in your care.  Below is a brief recap of your visit and next steps.  We will plan to see you again in 6 months.  Summary No medication changes today We will follow up in 6 months for repeat labs

## 2022-09-25 NOTE — Assessment & Plan Note (Addendum)
Lipid panel updated this week.  Total cholesterol 209 and LDL 134.  Her 10-year ASCVD risk score today is 4.8%. -Her cholesterol panel is essentially unchanged since November despite losing 11 pounds and making changes to her diet.  Her ASCVD risk remains low.  With this in mind, she would like to continue focusing on lifestyle modifications and is not interested in starting statin therapy at this time, which is a reasonable approach. -Follow-up in 6 months with repeat lipid panel at that time

## 2022-09-25 NOTE — Progress Notes (Signed)
Established Patient Office Visit  Subjective   Patient ID: Heather Willis, female    DOB: [birth year]  Age: 67 y.o. MRN: [id number]  Chief Complaint  Patient presents with   Hyperlipidemia    Follow up   [name] returns to care today for follow up.  She was last seen by me on 06/25/22 for annual exam.  Labs were updated at that time.  Her total cholesterol and LDL are elevated.  She wanted to attempt lifestyle modifications aimed at losing weight and improving her cholesterol.  49-monthfollow-up was arranged.  There have been no acute interval events.  Ms. HRossmillerreports feeling well today.  She is asymptomatic and has no additional concerns to discuss.  She has lost 11 pounds since her last appointment and 23 pounds since August.  Past Medical History:  Diagnosis Date   Arthritis    Closed left ankle fracture 2018   Fractures    Psoriatic arthritis ([place]    Past Surgical History:  Procedure Laterality Date   ABDOMINAL HERNIA REPAIR     18 yrs ago   CHOLECYSTECTOMY     HYSTERECTOMY ABDOMINAL WITH SALPINGECTOMY     at age 67  ORIF ANKLE FRACTURE Left 04/09/2017   Procedure: OPEN REDUCTION INTERNAL FIXATION (ORIF) LEFT ANKLE FRACTURE;  Surgeon: XLeandrew Koyanagi MD;  [facility]  Service: Orthopedics;  Laterality: Left;   REDUCTION MAMMAPLASTY     Social History   Tobacco Use   Smoking status: Former    Types: Cigarettes    Quit date: 1998    Years since quitting: 26.1   Smokeless tobacco: Never  Substance Use Topics   Alcohol use: No   Drug use: No   Family History  Problem Relation Age of Onset   Parkinsonism Mother    Throat cancer Father    Atrial fibrillation Father    Heart disease Father    Macular degeneration Father    Diabetes Sister    Atrial fibrillation Sister    Pancreatic cancer Sister    Breast cancer Neg Hx    Colon cancer Neg Hx    Allergies  Allergen Reactions   Penicillins    Review of Systems  Constitutional:   Negative for chills and fever.  HENT:  Negative for sore throat.   Respiratory:  Negative for cough and shortness of breath.   Cardiovascular:  Negative for chest pain, palpitations and leg swelling.  Gastrointestinal:  Negative for abdominal pain, blood in stool, constipation, diarrhea, nausea and vomiting.  Genitourinary:  Negative for dysuria and hematuria.  Musculoskeletal:  Negative for myalgias.  Skin:  Negative for itching and rash.  Neurological:  Negative for dizziness and headaches.  Psychiatric/Behavioral:  Negative for depression and suicidal ideas.      Objective:     BP 108/71   Pulse 64   Ht '5\' 4"'$  (1.626 m)   Wt 188 lb (85.3 kg)   SpO2 95%   BMI 32.27 kg/m  BP Readings from Last 3 Encounters:  09/25/22 108/71  06/25/22 (!) 146/80  03/26/22 130/88   Physical Exam Vitals reviewed.  Constitutional:      General: She is not in acute distress.    Appearance: Normal appearance. She is obese. She is not toxic-appearing.  HENT:     Head: Normocephalic and atraumatic.     Right Ear: External ear normal.     Left Ear: External ear normal.     Nose: Nose normal.  No congestion or rhinorrhea.     Mouth/Throat:     Mouth: Mucous membranes are moist.     Pharynx: Oropharynx is clear. No oropharyngeal exudate or posterior oropharyngeal erythema.  Eyes:     General: No scleral icterus.    Extraocular Movements: Extraocular movements intact.     Conjunctiva/sclera: Conjunctivae normal.     Pupils: Pupils are equal, round, and reactive to light.  Cardiovascular:     Rate and Rhythm: Normal rate and regular rhythm.     Pulses: Normal pulses.     Heart sounds: Normal heart sounds. No murmur heard.    No friction rub. No gallop.  Pulmonary:     Effort: Pulmonary effort is normal.     Breath sounds: Normal breath sounds. No wheezing, rhonchi or rales.  Abdominal:     General: Abdomen is flat. Bowel sounds are normal. There is no distension.     Palpations: Abdomen is  soft.     Tenderness: There is no abdominal tenderness.  Musculoskeletal:        General: No swelling. Normal range of motion.     Cervical back: Normal range of motion.     Right lower leg: No edema.     Left lower leg: No edema.  Lymphadenopathy:     Cervical: No cervical adenopathy.  Skin:    General: Skin is warm and dry.     Capillary Refill: Capillary refill takes less than 2 seconds.     Coloration: Skin is not jaundiced.  Neurological:     General: No focal deficit present.     Mental Status: She is alert and oriented to person, place, and time.  Psychiatric:        Mood and Affect: Mood normal.        Behavior: Behavior normal.   Last CBC Lab Results  Component Value Date   WBC 6.8 09/23/2022   HGB 13.6 09/23/2022   HCT 41.5 09/23/2022   MCV 91 09/23/2022   MCH 29.7 09/23/2022   RDW 13.3 09/23/2022   PLT 296 0000000   Last metabolic panel Lab Results  Component Value Date   GLUCOSE 91 06/24/2022   NA 139 06/24/2022   K 4.3 06/24/2022   CL 102 06/24/2022   CO2 21 06/24/2022   BUN 16 06/24/2022   CREATININE 0.75 06/24/2022   EGFR 87 06/24/2022   CALCIUM 9.9 06/24/2022   PROT 7.1 06/24/2022   ALBUMIN 4.0 06/24/2022   LABGLOB 3.1 06/24/2022   AGRATIO 1.3 06/24/2022   BILITOT 0.4 06/24/2022   ALKPHOS 64 06/24/2022   AST 19 06/24/2022   ALT 21 06/24/2022   Last lipids Lab Results  Component Value Date   CHOL 209 (H) 09/23/2022   HDL 65 09/23/2022   LDLCALC 134 (H) 09/23/2022   TRIG 53 09/23/2022   CHOLHDL 3.2 09/23/2022   Last hemoglobin A1c Lab Results  Component Value Date   HGBA1C 6.2 (H) 09/23/2022   Last thyroid functions Lab Results  Component Value Date   TSH 1.390 09/23/2022   Last vitamin D Lab Results  Component Value Date   VD25OH 44.6 09/23/2022   Last vitamin B12 and Folate Lab Results  Component Value Date   VITAMINB12 >2000 (H) 06/24/2022   FOLATE 11.7 06/24/2022   The 10-year ASCVD risk score (Arnett DK, et al.,  2019) is: 4.8%    Assessment & Plan:   Problem List Items Addressed This Visit       Hyperlipidemia -  Primary    Lipid panel updated this week.  Total cholesterol 209 and LDL 134.  Her 10-year ASCVD risk score today is 4.8%. -Her cholesterol panel is essentially unchanged since November despite losing 11 pounds and making changes to her diet.  Her ASCVD risk remains low.  With this in mind, she would like to continue focusing on lifestyle modifications and is not interested in starting statin therapy at this time, which is a reasonable approach. -Follow-up in 6 months with repeat lipid panel at that time      Prediabetes    A1c 6.2.  Her family medical history is significant for diabetes mellitus.  She will continue focusing on lifestyle modifications aimed at weight loss. -Follow-up in 6 months      Return in about 6 months (around 03/26/2023).   Johnette Abraham, MD

## 2022-09-25 NOTE — Assessment & Plan Note (Signed)
A1c 6.2.  Her family medical history is significant for diabetes mellitus.  She will continue focusing on lifestyle modifications aimed at weight loss. -Follow-up in 6 months

## 2022-10-15 ENCOUNTER — Encounter: Payer: Self-pay | Admitting: Physician Assistant

## 2022-10-15 ENCOUNTER — Telehealth: Payer: Medicare Other | Admitting: Physician Assistant

## 2022-10-15 DIAGNOSIS — U071 COVID-19: Secondary | ICD-10-CM | POA: Diagnosis not present

## 2022-10-15 MED ORDER — NIRMATRELVIR/RITONAVIR (PAXLOVID)TABLET: 3.0000 | ORAL_TABLET | Freq: Two times a day (BID) | ORAL | 0 refills | Status: AC

## 2022-10-15 MED ORDER — BENZONATATE 100 MG PO CAPS: 100.0000 mg | ORAL_CAPSULE | Freq: Three times a day (TID) | ORAL | 0 refills | Status: DC | PRN

## 2022-10-15 NOTE — Progress Notes (Signed)
Virtual Visit Consent   Heather Willis, you are scheduled for a virtual visit with a [facility] provider today. Just as with appointments in the office, your consent must be obtained to participate. Your consent will be active for this visit and any virtual visit you may have with one of our providers in the next 365 days. If you have a MyChart account, a copy of this consent can be sent to you electronically.  As this is a virtual visit, video technology does not allow for your provider to perform a traditional examination. This may limit your provider's ability to fully assess your condition. If your provider identifies any concerns that need to be evaluated in person or the need to arrange testing (such as labs, EKG, etc.), we will make arrangements to do so. Although advances in technology are sophisticated, we cannot ensure that it will always work on either your end or our end. If the connection with a video visit is poor, the visit may have to be switched to a telephone visit. With either a video or telephone visit, we are not always able to ensure that we have a secure connection.  By engaging in this virtual visit, you consent to the provision of healthcare and authorize for your insurance to be billed (if applicable) for the services provided during this visit. Depending on your insurance coverage, you may receive a charge related to this service.  I need to obtain your verbal consent now. Are you willing to proceed with your visit today? Heather Willis has provided verbal consent on 10/15/2022 for a virtual visit (video or telephone). Leeanne Rio, [place]  Date: 10/15/2022 11:57 AM  Virtual Visit via Video Note   I, Leeanne Rio, connected with  Heather Willis  ([id number], 1955-12-03) on 10/15/22 at  9:45 AM EDT by a video-enabled telemedicine application and verified that I am speaking with the correct person using two identifiers.  Location: Patient: Virtual Visit Location  Patient: Home Provider: Virtual Visit Location Provider: Home Office   I discussed the limitations of evaluation and management by telemedicine and the availability of in person appointments. The patient expressed understanding and agreed to proceed.    History of Present Illness: Heather Willis is a 67 y.o. who identifies as a female who was assigned female at birth, and is being seen today for 3 days of nighttime fever only (100-101), associated with nasal congestion, sinus pressure and ear pressure. No aching. Some chills at nighttime. Notes cough with substantial chest congestion now. Took OTC Mucinex last night but unsure if it helped.   Took home COVID test at time of visit -- positive.    HPI: HPI  Problems:  Patient Active Problem List   Diagnosis Date Noted   Prediabetes 09/25/2022   Encounter for well adult exam with abnormal findings 06/27/2022   Hyperlipidemia 06/27/2022   Leg wound, left, subsequent encounter 03/26/2022   Osteopenia 02/21/2022   Abnormal mammogram 02/21/2022   Obesity 01/24/2022   Post-menopausal 01/24/2022   Encounter for screening mammogram for malignant neoplasm of breast 01/24/2022   Acute foot pain, left 01/24/2022   Screening for colon cancer 01/24/2022   Fracture of left ankle, lateral malleolus 04/07/2017    Allergies:  Allergies  Allergen Reactions   Penicillins    Medications:  Current Outpatient Medications:    benzonatate (TESSALON) 100 MG capsule, Take 1 capsule (100 mg total) by mouth 3 (three) times daily as needed for cough., Disp: 30 capsule, Rfl: 0  nirmatrelvir/ritonavir (PAXLOVID) 20 x 150 MG & 10 x 100MG  TABS, Take 3 tablets by mouth 2 (two) times daily for 5 days. (Take nirmatrelvir 150 mg two tablets twice daily for 5 days and ritonavir 100 mg one tablet twice daily for 5 days) Patient GFR is > 60, Disp: 30 tablet, Rfl: 0   Calcium Carb-Cholecalciferol (CALTRATE 600+D3) 600-20 MG-MCG TABS, Take 600 mg by mouth 2 (two) times  daily., Disp: 180 tablet, Rfl: 1   Digestive Enzymes (SUPER ENZYMES) TABS, Take by mouth., Disp: , Rfl:    Magnesium Sulfate 2000 MG/3.2ML SOLN, Take 1 tablet by mouth daily., Disp: , Rfl:   Observations/Objective: Patient is well-developed, well-nourished in no acute distress.  Resting comfortably outside at home.  Head is normocephalic, atraumatic.  No labored breathing.  Speech is clear and coherent with logical content.  Patient is alert and oriented at baseline.   Assessment and Plan: 1. COVID-19 - nirmatrelvir/ritonavir (PAXLOVID) 20 x 150 MG & 10 x 100MG  TABS; Take 3 tablets by mouth 2 (two) times daily for 5 days. (Take nirmatrelvir 150 mg two tablets twice daily for 5 days and ritonavir 100 mg one tablet twice daily for 5 days) Patient GFR is > 60  Dispense: 30 tablet; Refill: 0  Patient with multiple risk factors for complicated course of illness. Discussed risks/benefits of antiviral medications including most common potential ADRs. Patient voiced understanding and would like to proceed with antiviral medication. They are candidate for Paxlovid. Rx sent to pharmacy. Supportive measures, OTC medications and vitamin regimen reviewed. TEssalon per orders. Quarantine reviewed in detail. Strict ER precautions discussed with patient.    Follow Up Instructions: I discussed the assessment and treatment plan with the patient. The patient was provided an opportunity to ask questions and all were answered. The patient agreed with the plan and demonstrated an understanding of the instructions.  A copy of instructions were sent to the patient via MyChart unless otherwise noted below.    The patient was advised to call back or seek an in-person evaluation if the symptoms worsen or if the condition fails to improve as anticipated.  Time:  I spent 10 minutes with the patient via telehealth technology discussing the above problems/concerns.    Leeanne Rio, PA-C

## 2022-10-15 NOTE — Patient Instructions (Signed)
Heather Willis, thank you for joining Leeanne Rio, PA-C for today's virtual visit.  While this provider is not your primary care provider (PCP), if your PCP is located in our provider database this encounter information will be shared with them immediately following your visit.   [facility] account gives you access to today's visit and all your visits, tests, and labs performed at [facility] " click here if you don't have a [facility] account or go to [email or web address]  Consent: (Patient) Heather Willis provided verbal consent for this virtual visit at the beginning of the encounter.  Current Medications:  Current Outpatient Medications:    Calcium Carb-Cholecalciferol (CALTRATE 600+D3) 600-20 MG-MCG TABS, Take 600 mg by mouth 2 (two) times daily., Disp: 180 tablet, Rfl: 1   Digestive Enzymes (SUPER ENZYMES) TABS, Take by mouth., Disp: , Rfl:    Magnesium Sulfate 2000 MG/3.2ML SOLN, Take 1 tablet by mouth daily., Disp: , Rfl:    Medications ordered in this encounter:  No orders of the defined types were placed in this encounter.    *If you need refills on other medications prior to your next appointment, please contact your pharmacy*  Follow-Up: Call back or seek an in-person evaluation if the symptoms worsen or if the condition fails to improve as anticipated.  [place] [phone]  Other Instructions Please keep well-hydrated and get plenty of rest. Start a saline nasal rinse to flush out your nasal passages. You can use plain Mucinex to help thin congestion. Take the Paxlovid as directed. If you have a humidifier, running in the bedroom at night. I want you to start OTC vitamin D3 1000 units daily, vitamin C 1000 mg daily, and a zinc supplement. Please take prescribed medications as directed.  You were to quarantine until you have had no fever for 24 hours and you are feeling better, you can end quarantine but need  to mask for an additional 5 days.   If you note any worsening of symptoms, any significant shortness of breath or any chest pain, please seek ER evaluation ASAP.  Please do not delay care!  COVID-19: What to Do if You Are Sick If you test positive and are an older adult or someone who is at high risk of getting very sick from COVID-19, treatment may be available. Contact a healthcare provider right away after a positive test to determine if you are eligible, even if your symptoms are mild right now. You can also visit a Test to Treat location and, if eligible, receive a prescription from a provider. Don't delay: Treatment must be started within the first few days to be effective. If you have a fever, cough, or other symptoms, you might have COVID-19. Most people have mild illness and are able to recover at home. If you are sick: Keep track of your symptoms. If you have an emergency warning sign (including trouble breathing), call 911. Steps to help prevent the spread of COVID-19 if you are sick If you are sick with COVID-19 or think you might have COVID-19, follow the steps below to care for yourself and to help protect other people in your home and community. Stay home except to get medical care Stay home. Most people with COVID-19 have mild illness and can recover at home without medical care. Do not leave your home, except to get medical care. Do not visit public areas and do not go to places where you are unable to wear a  mask. Take care of yourself. Get rest and stay hydrated. Take over-the-counter medicines, such as acetaminophen, to help you feel better. Stay in touch with your doctor. Call before you get medical care. Be sure to get care if you have trouble breathing, or have any other emergency warning signs, or if you think it is an emergency. Avoid public transportation, ride-sharing, or taxis if possible. Get tested If you have symptoms of COVID-19, get tested. While waiting for test  results, stay away from others, including staying apart from those living in your household. Get tested as soon as possible after your symptoms start. Treatments may be available for people with COVID-19 who are at risk for becoming very sick. Don't delay: Treatment must be started early to be effective--some treatments must begin within 5 days of your first symptoms. Contact your healthcare provider right away if your test result is positive to determine if you are eligible. Self-tests are one of several options for testing for the virus that causes COVID-19 and may be more convenient than laboratory-based tests and point-of-care tests. Ask your healthcare provider or your local health department if you need help interpreting your test results. You can visit your state, tribal, local, and territorial health department's website to look for the latest local information on testing sites. Separate yourself from other people As much as possible, stay in a specific room and away from other people and pets in your home. If possible, you should use a separate bathroom. If you need to be around other people or animals in or outside of the home, wear a well-fitting mask. Tell your close contacts that they may have been exposed to COVID-19. An infected person can spread COVID-19 starting 48 hours (or 2 days) before the person has any symptoms or tests positive. By letting your close contacts know they may have been exposed to COVID-19, you are helping to protect everyone. See COVID-19 and Animals if you have questions about pets. If you are diagnosed with COVID-19, someone from the health department may call you. Answer the call to slow the spread. Monitor your symptoms Symptoms of COVID-19 include fever, cough, or other symptoms. Follow care instructions from your healthcare provider and local health department. Your local health authorities may give instructions on checking your symptoms and reporting  information. When to seek emergency medical attention Look for emergency warning signs* for COVID-19. If someone is showing any of these signs, seek emergency medical care immediately: Trouble breathing Persistent pain or pressure in the chest New confusion Inability to wake or stay awake Pale, gray, or blue-colored skin, lips, or nail beds, depending on skin tone *This list is not all possible symptoms. Please call your medical provider for any other symptoms that are severe or concerning to you. Call 911 or call ahead to your local emergency facility: Notify the operator that you are seeking care for someone who has or may have COVID-19. Call ahead before visiting your doctor Call ahead. Many medical visits for routine care are being postponed or done by phone or telemedicine. If you have a medical appointment that cannot be postponed, call your doctor's office, and tell them you have or may have COVID-19. This will help the office protect themselves and other patients. If you are sick, wear a well-fitting mask You should wear a mask if you must be around other people or animals, including pets (even at home). Wear a mask with the best fit, protection, and comfort for you. You don't need to  wear the mask if you are alone. If you can't put on a mask (because of trouble breathing, for example), cover your coughs and sneezes in some other way. Try to stay at least 6 feet away from other people. This will help protect the people around you. Masks should not be placed on young children under age 73 years, anyone who has trouble breathing, or anyone who is not able to remove the mask without help. Cover your coughs and sneezes Cover your mouth and nose with a tissue when you cough or sneeze. Throw away used tissues in a lined trash can. Immediately wash your hands with soap and water for at least 20 seconds. If soap and water are not available, clean your hands with an alcohol-based hand sanitizer  that contains at least 60% alcohol. Clean your hands often Wash your hands often with soap and water for at least 20 seconds. This is especially important after blowing your nose, coughing, or sneezing; going to the bathroom; and before eating or preparing food. Use hand sanitizer if soap and water are not available. Use an alcohol-based hand sanitizer with at least 60% alcohol, covering all surfaces of your hands and rubbing them together until they feel dry. Soap and water are the best option, especially if hands are visibly dirty. Avoid touching your eyes, nose, and mouth with unwashed hands. Handwashing Tips Avoid sharing personal household items Do not share dishes, drinking glasses, cups, eating utensils, towels, or bedding with other people in your home. Wash these items thoroughly after using them with soap and water or put in the dishwasher. Clean surfaces in your home regularly Clean and disinfect high-touch surfaces (for example, doorknobs, tables, handles, light switches, and countertops) in your "sick room" and bathroom. In shared spaces, you should clean and disinfect surfaces and items after each use by the person who is ill. If you are sick and cannot clean, a caregiver or other person should only clean and disinfect the area around you (such as your bedroom and bathroom) on an as needed basis. Your caregiver/other person should wait as long as possible (at least several hours) and wear a mask before entering, cleaning, and disinfecting shared spaces that you use. Clean and disinfect areas that may have blood, stool, or body fluids on them. Use household cleaners and disinfectants. Clean visible dirty surfaces with household cleaners containing soap or detergent. Then, use a household disinfectant. Use a product from [employer] List N: Disinfectants for Coronavirus (ZDGUY-40). Be sure to follow the instructions on the label to ensure safe and effective use of the product. Many products  recommend keeping the surface wet with a disinfectant for a certain period of time (look at "contact time" on the product label). You may also need to wear personal protective equipment, such as gloves, depending on the directions on the product label. Immediately after disinfecting, wash your hands with soap and water for 20 seconds. For completed guidance on cleaning and disinfecting your home, visit Complete Disinfection Guidance. Take steps to improve ventilation at home Improve ventilation (air flow) at home to help prevent from spreading COVID-19 to other people in your household. Clear out COVID-19 virus particles in the air by opening windows, using air filters, and turning on fans in your home. Use this interactive tool to learn how to improve air flow in your home. When you can be around others after being sick with COVID-19 Deciding when you can be around others is different for different situations. Find out  when you can safely end home isolation. For any additional questions about your care, contact your healthcare provider or state or local health department. 10/17/2020 Content source: [facility] ([facility]), Division of Viral Diseases This information is not intended to replace advice given to you by your health care provider. Make sure you discuss any questions you have with your health care provider. Document Revised: 11/30/2020 Document Reviewed: 11/30/2020 Elsevier Patient Education  2022 [employer].      If you have been instructed to have an in-person evaluation today at a local Urgent Care facility, please use the link below. It will take you to a list of all of our available [facility], including address, phone number and hours of operation. Please do not delay care.  [facility]  If you or a family member do not have a primary care provider, use the link below to schedule a visit and establish  care. When you choose a [facility] primary care physician or advanced practice provider, you gain a long-term partner in health. Find a Primary Care Provider  Learn more about [facility]'s in-office and virtual care options: [facility]

## 2022-11-06 DIAGNOSIS — H524 Presbyopia: Secondary | ICD-10-CM | POA: Diagnosis not present

## 2023-01-11 ENCOUNTER — Telehealth: Payer: Medicare Other | Admitting: Family Medicine

## 2023-01-11 DIAGNOSIS — B349 Viral infection, unspecified: Secondary | ICD-10-CM

## 2023-01-11 MED ORDER — OSELTAMIVIR PHOSPHATE 75 MG PO CAPS: 75.0000 mg | ORAL_CAPSULE | Freq: Two times a day (BID) | ORAL | 0 refills | Status: AC

## 2023-01-11 NOTE — Patient Instructions (Signed)
Fever, Adult     A fever is a high body temperature that is 100.31F (38C) or higher. Brief mild or moderate fevers generally have no lasting effects, and they often do not need treatment. Moderate or high fevers can feel uncomfortable and can sometimes be a sign of a serious problem. Fevers can also cause dehydration because the body may sweat, especially if the fever keeps coming back or lasts a long time. You can use a thermometer to check for a fever. Body temperature can change with: Age. Time of day. Where the temperature is taken, such as in the mouth, rectum, ear, under the arm, or on the forehead. Follow these instructions at home: Medicines Take over-the-counter and prescription medicines only as told by your health care provider. Follow instructions on how much medicine to take and how often. If you were prescribed antibiotics, take them as told by your provider. Do not stop using the antibiotic even if you start to feel better. General instructions Watch for any changes in your symptoms. Let your provider know about them. Rest as needed. Drink enough fluid to keep your pee (urine) pale yellow. This helps to prevent dehydration. Bathe or sponge bathe with room-temperature water to help lower your body temperature as needed. Do not use cold water. Do not use too many blankets or wear heavy clothes. Stay home from work and public places for at least 24 hours after your fever is gone. Your fever should be gone without having to use medicines. Contact a health care provider if: You have vomiting or diarrhea that does not get better. You cannot eat or drink without vomiting. You have pain when you pee (urinate). Your symptoms do not get better with treatment or you have new symptoms. You have a skin rash. You have signs of dehydration, such as: Dark pee, very little pee, or no pee. Cracked lips or dry mouth. Sunken eyes. Sleepiness. Weakness. Get help right away if: You have  shortness of breath or trouble breathing. You feel dizzy or you faint. You are confused and do not know the time of day, where you are, or who you are (disoriented). You have severe pain in your abdomen. These symptoms may be an emergency. Get help right away. Call 911. Do not wait to see if the symptoms will go away. Do not drive yourself to the hospital. This information is not intended to replace advice given to you by your health care provider. Make sure you discuss any questions you have with your health care provider. Document Revised: 04/16/2022 Document Reviewed: 04/16/2022 Elsevier Patient Education  2024 [employer].

## 2023-01-11 NOTE — Progress Notes (Signed)
Virtual Visit Consent   Heather Willis, you are scheduled for a virtual visit with a [facility] provider today. Just as with appointments in the office, your consent must be obtained to participate. Your consent will be active for this visit and any virtual visit you may have with one of our providers in the next 365 days. If you have a MyChart account, a copy of this consent can be sent to you electronically.  As this is a virtual visit, video technology does not allow for your provider to perform a traditional examination. This may limit your provider's ability to fully assess your condition. If your provider identifies any concerns that need to be evaluated in person or the need to arrange testing (such as labs, EKG, etc.), we will make arrangements to do so. Although advances in technology are sophisticated, we cannot ensure that it will always work on either your end or our end. If the connection with a video visit is poor, the visit may have to be switched to a telephone visit. With either a video or telephone visit, we are not always able to ensure that we have a secure connection.  By engaging in this virtual visit, you consent to the provision of healthcare and authorize for your insurance to be billed (if applicable) for the services provided during this visit. Depending on your insurance coverage, you may receive a charge related to this service.  I need to obtain your verbal consent now. Are you willing to proceed with your visit today? Alverta Mcgorty has provided verbal consent on 01/11/2023 for a virtual visit (video or telephone). Georgana Curio, FNP  Date: 01/11/2023 11:37 AM  Virtual Visit via Video Note   I, Georgana Curio, connected with  Heather Willis  ([id number], [birth year]) on 01/11/23 at 11:30 AM EDT by a video-enabled telemedicine application and verified that I am speaking with the correct person using two identifiers.  Location: Patient: Virtual Visit Location Patient: Home Provider:  Virtual Visit Location Provider: Home Office   I discussed the limitations of evaluation and management by telemedicine and the availability of in person appointments. The patient expressed understanding and agreed to proceed.    History of Present Illness: Heather Willis is a 67 y.o. who identifies as a female who was assigned female at birth, and is being seen today for fever, body aches and chills. No cough, wheezing or sob. Neg covid testing. She feels she has the flu. Denies urinary sx. She is in no distress. Sx started Thursday. Marland Kitchen  HPI: HPI  Problems:  Patient Active Problem List   Diagnosis Date Noted   Prediabetes 09/25/2022   Encounter for well adult exam with abnormal findings 06/27/2022   Hyperlipidemia 06/27/2022   Leg wound, left, subsequent encounter 03/26/2022   Osteopenia 02/21/2022   Abnormal mammogram 02/21/2022   Obesity 01/24/2022   Post-menopausal 01/24/2022   Encounter for screening mammogram for malignant neoplasm of breast 01/24/2022   Acute foot pain, left 01/24/2022   Screening for colon cancer 01/24/2022   Fracture of left ankle, lateral malleolus 04/07/2017    Allergies:  Allergies  Allergen Reactions   Penicillins    Medications:  Current Outpatient Medications:    benzonatate (TESSALON) 100 MG capsule, Take 1 capsule (100 mg total) by mouth 3 (three) times daily as needed for cough., Disp: 30 capsule, Rfl: 0   Calcium Carb-Cholecalciferol (CALTRATE 600+D3) 600-20 MG-MCG TABS, Take 600 mg by mouth 2 (two) times daily., Disp: 180 tablet, Rfl: 1  Digestive Enzymes (SUPER ENZYMES) TABS, Take by mouth., Disp: , Rfl:    Magnesium Sulfate 2000 MG/3.2ML SOLN, Take 1 tablet by mouth daily., Disp: , Rfl:   Observations/Objective: Patient is well-developed, well-nourished in no acute distress.  Resting comfortably  at home.  Head is normocephalic, atraumatic.  No labored breathing.  Speech is clear and coherent with logical content.  Patient is alert and  oriented at baseline.    Assessment and Plan: 1. Viral illness  Increase fluids, tylenol or ibuprofen, UC if sx persist or worsen.   Follow Up Instructions: I discussed the assessment and treatment plan with the patient. The patient was provided an opportunity to ask questions and all were answered. The patient agreed with the plan and demonstrated an understanding of the instructions.  A copy of instructions were sent to the patient via MyChart unless otherwise noted below.     The patient was advised to call back or seek an in-person evaluation if the symptoms worsen or if the condition fails to improve as anticipated.  Time:  I spent 10 minutes with the patient via telehealth technology discussing the above problems/concerns.    Georgana Curio, FNP

## 2023-01-13 ENCOUNTER — Telehealth: Payer: Self-pay | Admitting: Internal Medicine

## 2023-01-13 NOTE — Telephone Encounter (Signed)
Pt called in has been diagnosed with flu since Thursday   Has taken 2 pills and is not feeling better. Wants a call back as second opinion , patient feels like she should be feeling better already.

## 2023-01-14 ENCOUNTER — Encounter: Payer: Self-pay | Admitting: Internal Medicine

## 2023-01-14 ENCOUNTER — Telehealth (INDEPENDENT_AMBULATORY_CARE_PROVIDER_SITE_OTHER): Payer: Medicare Other | Admitting: Internal Medicine

## 2023-01-14 DIAGNOSIS — R110 Nausea: Secondary | ICD-10-CM | POA: Diagnosis not present

## 2023-01-14 DIAGNOSIS — B349 Viral infection, unspecified: Secondary | ICD-10-CM | POA: Diagnosis not present

## 2023-01-14 MED ORDER — ONDANSETRON HCL 4 MG PO TABS: 4.0000 mg | ORAL_TABLET | Freq: Three times a day (TID) | ORAL | 0 refills | Status: DC | PRN

## 2023-01-14 NOTE — Assessment & Plan Note (Signed)
Evaluated today through virtual encounter for the symptoms endorsed above.  She was empirically treated for influenza with Tamiflu.  Symptoms of greatest concern today are diarrhea and nausea without vomiting.  She has not experienced any additional fever/chills, and denies cough/pain production and shortness of breath.  Overall she feels as though symptoms are starting to improve.  Symptoms endorsed today seem most consistent with viral illness. -Recommend continued supportive care measures.  I will prescribe Zofran for as needed nausea relief. -She was counseled on the importance of adequate fluid intake -She was instructed return to care by the end of this week if symptoms do not continue to improve, she develops additional fever, or develops cough with sputum production.

## 2023-01-14 NOTE — Telephone Encounter (Signed)
Scheduled 06.18.2024 at 10:00 am.

## 2023-01-14 NOTE — Progress Notes (Signed)
   Virtual Visit via Video Note  I connected with Heather Willis on 01/14/23 at 10:00 AM EDT by a video enabled telemedicine application and verified that I am speaking with the correct person using two identifiers.  Patient Location: Home Provider Location: Office/Clinic  I discussed the limitations, risks, security, and privacy concerns of performing an evaluation and management service by video and the availability of in person appointments. I also discussed with the patient that there may be a patient responsible charge related to this service. The patient expressed understanding and agreed to proceed.  Subjective: PCP: Billie Lade, MD  Chief Complaint  Patient presents with   Influenza   Heather Willis has been evaluated today through video encounter for follow-up of viral illness.  She reports onset of symptoms last Thursday (6/13).  At that time she endorsed fever with Tmax 102F, myalgias, nausea, and diarrhea.  She was empirically treated for influenza with Tamiflu, which she started 3 days ago.  Symptoms of greatest concern today are nausea and diarrhea.  She denies additional fever/chills, cough/sputum production, and shortness of breath.  She states that today is the first day she has started to feel better but still does not feel well.  She has not been able to eat due to nausea.   ROS: Per HPI  Current Outpatient Medications:    benzonatate (TESSALON) 100 MG capsule, Take 1 capsule (100 mg total) by mouth 3 (three) times daily as needed for cough., Disp: 30 capsule, Rfl: 0   Calcium Carb-Cholecalciferol (CALTRATE 600+D3) 600-20 MG-MCG TABS, Take 600 mg by mouth 2 (two) times daily., Disp: 180 tablet, Rfl: 1   Digestive Enzymes (SUPER ENZYMES) TABS, Take by mouth., Disp: , Rfl:    Magnesium Sulfate 2000 MG/3.2ML SOLN, Take 1 tablet by mouth daily., Disp: , Rfl:    oseltamivir (TAMIFLU) 75 MG capsule, Take 1 capsule (75 mg total) by mouth 2 (two) times daily for 5 days., Disp: 10  capsule, Rfl: 0  Assessment and Plan:  Viral illness Assessment & Plan: Evaluated today through virtual encounter for the symptoms endorsed above.  She was empirically treated for influenza with Tamiflu.  Symptoms of greatest concern today are diarrhea and nausea without vomiting.  She has not experienced any additional fever/chills, and denies cough/pain production and shortness of breath.  Overall she feels as though symptoms are starting to improve.  Symptoms endorsed today seem most consistent with viral illness. -Recommend continued supportive care measures.  I will prescribe Zofran for as needed nausea relief. -She was counseled on the importance of adequate fluid intake -She was instructed return to care by the end of this week if symptoms do not continue to improve, she develops additional fever, or develops cough with sputum production.  Follow Up Instructions: Return if symptoms worsen or fail to improve.   I discussed the assessment and treatment plan with the patient. The patient was provided an opportunity to ask questions, and all were answered. The patient agreed with the plan and demonstrated an understanding of the instructions.   The patient was advised to call back or seek an in-person evaluation if the symptoms worsen or if the condition fails to improve as anticipated.  The above assessment and management plan was discussed with the patient. The patient verbalized understanding of and has agreed to the management plan.   Billie Lade, MD

## 2023-03-19 DIAGNOSIS — F4322 Adjustment disorder with anxiety: Secondary | ICD-10-CM | POA: Diagnosis not present

## 2023-03-26 ENCOUNTER — Encounter: Payer: Self-pay | Admitting: Internal Medicine

## 2023-03-26 ENCOUNTER — Ambulatory Visit (INDEPENDENT_AMBULATORY_CARE_PROVIDER_SITE_OTHER): Payer: Medicare Other | Admitting: Internal Medicine

## 2023-03-26 VITALS — BP 127/76 | HR 82 | Ht 64.0 in | Wt 195.6 lb

## 2023-03-26 DIAGNOSIS — Z872 Personal history of diseases of the skin and subcutaneous tissue: Secondary | ICD-10-CM | POA: Diagnosis not present

## 2023-03-26 DIAGNOSIS — Z23 Encounter for immunization: Secondary | ICD-10-CM | POA: Insufficient documentation

## 2023-03-26 DIAGNOSIS — Z6379 Other stressful life events affecting family and household: Secondary | ICD-10-CM | POA: Diagnosis not present

## 2023-03-26 NOTE — Progress Notes (Signed)
Established Patient Office Visit  Subjective   Patient ID: Heather Willis, female    DOB: 03-12-1956  Age: 67 y.o. MRN: [id number]  Chief Complaint  Patient presents with   Follow-up    6 months   Heather Willis returns to care today for routine follow-up.  She was last evaluated by me for an acute visit through video encounter on 6/18 endorsing symptoms concerning for viral URI.  Previously evaluated by me for routine follow-up on 2/28.  No medication changes were made at that time and 18-month follow-up was arranged. Heather Willis reports feeling fairly well today.  She endorses increased stress recently related to her mother and sister.  This has led to weight gain (7 pounds), which she attributes to stress eating.  She additionally endorses pain at the tips of her index fingers.  She reports a past medical history significant for psoriatic arthritis.  Symptoms have previously been well-controlled, but she has noted increased pain in her fingers over the last 6 months.  Not currently followed by rheumatology.  She is not interested in starting daily treatment for psoriatic arthritis and has been using NSAIDs/Tylenol as needed for pain relief.  Past Medical History:  Diagnosis Date   Arthritis    Closed left ankle fracture 2018   Fracture of left ankle, lateral malleolus 04/07/2017   Fractures    Leg wound, left, subsequent encounter 03/26/2022   Psoriatic arthritis (HCC)    Past Surgical History:  Procedure Laterality Date   ABDOMINAL HERNIA REPAIR     18 yrs ago   CHOLECYSTECTOMY     HYSTERECTOMY ABDOMINAL WITH SALPINGECTOMY     at age 56   ORIF ANKLE FRACTURE Left 04/09/2017   Procedure: OPEN REDUCTION INTERNAL FIXATION (ORIF) LEFT ANKLE FRACTURE;  Surgeon: Tarry Kos, MD;  Location: [facility];  Service: Orthopedics;  Laterality: Left;   REDUCTION MAMMAPLASTY     Social History   Tobacco Use   Smoking status: Former    Current packs/day: 0.00    Types: Cigarettes     Quit date: 1998    Years since quitting: 26.6   Smokeless tobacco: Never  Substance Use Topics   Alcohol use: No   Drug use: No   Family History  Problem Relation Age of Onset   Parkinsonism Mother    Throat cancer Father    Atrial fibrillation Father    Heart disease Father    Macular degeneration Father    Diabetes Sister    Atrial fibrillation Sister    Pancreatic cancer Sister    Breast cancer Neg Hx    Colon cancer Neg Hx    Allergies  Allergen Reactions   Penicillins    Review of Systems  Musculoskeletal:  Positive for joint pain (Pain in the DIPs of both index fingers).  Psychiatric/Behavioral:         Increased stress     Objective:     BP 127/76   Pulse 82   Ht 5\' 4"  (1.626 m)   Wt 195 lb 9.6 oz (88.7 kg)   SpO2 94%   BMI 33.57 kg/m  BP Readings from Last 3 Encounters:  03/26/23 127/76  09/25/22 108/71  06/25/22 (!) 146/80   Physical Exam Vitals reviewed.  Constitutional:      General: She is not in acute distress.    Appearance: Normal appearance. She is obese. She is not toxic-appearing.  HENT:     Head: Normocephalic and atraumatic.  Right Ear: External ear normal.     Left Ear: External ear normal.     Nose: Nose normal. No congestion or rhinorrhea.     Mouth/Throat:     Mouth: Mucous membranes are moist.     Pharynx: Oropharynx is clear. No oropharyngeal exudate or posterior oropharyngeal erythema.  Eyes:     General: No scleral icterus.    Extraocular Movements: Extraocular movements intact.     Conjunctiva/sclera: Conjunctivae normal.     Pupils: Pupils are equal, round, and reactive to light.  Cardiovascular:     Rate and Rhythm: Normal rate and regular rhythm.     Pulses: Normal pulses.     Heart sounds: Normal heart sounds. No murmur heard.    No friction rub. No gallop.  Pulmonary:     Effort: Pulmonary effort is normal.     Breath sounds: Normal breath sounds. No wheezing, rhonchi or rales.  Abdominal:     General:  Abdomen is flat. Bowel sounds are normal. There is no distension.     Palpations: Abdomen is soft.     Tenderness: There is no abdominal tenderness.  Musculoskeletal:        General: Tenderness (TTP over the DIPs of both index fingers) present. No swelling. Normal range of motion.     Cervical back: Normal range of motion.     Right lower leg: No edema.     Left lower leg: No edema.  Lymphadenopathy:     Cervical: No cervical adenopathy.  Skin:    General: Skin is warm and dry.     Capillary Refill: Capillary refill takes less than 2 seconds.     Coloration: Skin is not jaundiced.  Neurological:     General: No focal deficit present.     Mental Status: She is alert and oriented to person, place, and time.  Psychiatric:        Mood and Affect: Mood normal.        Behavior: Behavior normal.   Last CBC Lab Results  Component Value Date   WBC 6.8 09/23/2022   HGB 13.6 09/23/2022   HCT 41.5 09/23/2022   MCV 91 09/23/2022   MCH 29.7 09/23/2022   RDW 13.3 09/23/2022   PLT 296 09/23/2022   Last metabolic panel Lab Results  Component Value Date   GLUCOSE 91 06/24/2022   NA 139 06/24/2022   K 4.3 06/24/2022   CL 102 06/24/2022   CO2 21 06/24/2022   BUN 16 06/24/2022   CREATININE 0.75 06/24/2022   EGFR 87 06/24/2022   CALCIUM 9.9 06/24/2022   PROT 7.1 06/24/2022   ALBUMIN 4.0 06/24/2022   LABGLOB 3.1 06/24/2022   AGRATIO 1.3 06/24/2022   BILITOT 0.4 06/24/2022   ALKPHOS 64 06/24/2022   AST 19 06/24/2022   ALT 21 06/24/2022   Last lipids Lab Results  Component Value Date   CHOL 209 (H) 09/23/2022   HDL 65 09/23/2022   LDLCALC 134 (H) 09/23/2022   TRIG 53 09/23/2022   CHOLHDL 3.2 09/23/2022   Last hemoglobin A1c Lab Results  Component Value Date   HGBA1C 6.2 (H) 09/23/2022   Last thyroid functions Lab Results  Component Value Date   TSH 1.390 09/23/2022   Last vitamin D Lab Results  Component Value Date   VD25OH 44.6 09/23/2022   Last vitamin B12 and  Folate Lab Results  Component Value Date   VITAMINB12 >2000 (H) 06/24/2022   FOLATE 11.7 06/24/2022   The 10-year ASCVD  risk score (Arnett DK, et al., 2019) is: 6.6%    Assessment & Plan:   Problem List Items Addressed This Visit       History of psoriatic arthritis    Diagnosed at age 11.  She has been asymptomatic until recently when she has noticed pain in the DIPs of both index fingers.  There is tenderness to palpation on exam today.  She is not followed by rheumatology and is not interested in starting daily medication for treatment of psoriatic arthritis.  She has been managing pain with as needed use of Tylenol/NSAIDs. -Recommend Voltaren gel as needed for pain relief -She will contact our office if she would like to establish care with rheumatology      Stress due to illness of family member    Heather Willis's mother recently turned 68 years old and her health has started to decline.  Her sister is also struggling with mental illness and DSS was recently called.  This has caused her significant stress.  For now, she is focused on homeopathic measures to manage her stress.  She has a Veterinary surgeon and is focusing on exercise and meditation.  She is not interested in any medications or a referral to psychiatry currently.      Need for influenza vaccination    Influenza vaccine administered today      Return in about 6 months (around 09/26/2023) for CPE.   Billie Lade, MD

## 2023-03-26 NOTE — Assessment & Plan Note (Signed)
Diagnosed at age 67.  She has been asymptomatic until recently when she has noticed pain in the DIPs of both index fingers.  There is tenderness to palpation on exam today.  She is not followed by rheumatology and is not interested in starting daily medication for treatment of psoriatic arthritis.  She has been managing pain with as needed use of Tylenol/NSAIDs. -Recommend Voltaren gel as needed for pain relief -She will contact our office if she would like to establish care with rheumatology

## 2023-03-26 NOTE — Assessment & Plan Note (Signed)
Heather Willis mother recently turned 68 years old and her health has started to decline.  Her sister is also struggling with mental illness and DSS was recently called.  This has caused her significant stress.  For now, she is focused on homeopathic measures to manage her stress.  She has a Veterinary surgeon and is focusing on exercise and meditation.  She is not interested in any medications or a referral to psychiatry currently.

## 2023-03-26 NOTE — Assessment & Plan Note (Signed)
Influenza vaccine administered today.

## 2023-03-26 NOTE — Patient Instructions (Addendum)
It was a pleasure to see you today.  Thank you for giving [place] the opportunity to be involved in your care.  Below is a brief recap of your visit and next steps.  We will plan to see you again in 6 months.  Summary No medication changes today I recommend trying Voltaren gel for relief of hand pain Follow up in 6 months     Schedule your Medicare Annual Wellness Visit at checkout.

## 2023-04-07 DIAGNOSIS — K080 Exfoliation of teeth due to systemic causes: Secondary | ICD-10-CM | POA: Diagnosis not present

## 2023-04-08 DIAGNOSIS — F4322 Adjustment disorder with anxiety: Secondary | ICD-10-CM | POA: Diagnosis not present

## 2023-04-22 DIAGNOSIS — F4322 Adjustment disorder with anxiety: Secondary | ICD-10-CM | POA: Diagnosis not present

## 2023-05-20 DIAGNOSIS — F4322 Adjustment disorder with anxiety: Secondary | ICD-10-CM | POA: Diagnosis not present

## 2023-06-10 DIAGNOSIS — F4322 Adjustment disorder with anxiety: Secondary | ICD-10-CM | POA: Diagnosis not present

## 2023-07-01 DIAGNOSIS — F4322 Adjustment disorder with anxiety: Secondary | ICD-10-CM | POA: Diagnosis not present

## 2023-07-15 DIAGNOSIS — F4322 Adjustment disorder with anxiety: Secondary | ICD-10-CM | POA: Diagnosis not present

## 2023-07-31 DIAGNOSIS — F4322 Adjustment disorder with anxiety: Secondary | ICD-10-CM | POA: Diagnosis not present

## 2023-08-12 DIAGNOSIS — F4322 Adjustment disorder with anxiety: Secondary | ICD-10-CM | POA: Diagnosis not present

## 2023-08-26 DIAGNOSIS — F4322 Adjustment disorder with anxiety: Secondary | ICD-10-CM | POA: Diagnosis not present

## 2023-09-11 DIAGNOSIS — F4322 Adjustment disorder with anxiety: Secondary | ICD-10-CM | POA: Diagnosis not present

## 2023-09-30 ENCOUNTER — Telehealth: Payer: Self-pay | Admitting: Internal Medicine

## 2023-09-30 DIAGNOSIS — R7303 Prediabetes: Secondary | ICD-10-CM

## 2023-09-30 DIAGNOSIS — Z0001 Encounter for general adult medical examination with abnormal findings: Secondary | ICD-10-CM

## 2023-09-30 DIAGNOSIS — E782 Mixed hyperlipidemia: Secondary | ICD-10-CM

## 2023-09-30 NOTE — Telephone Encounter (Unsigned)
 Copied from CRM [id number]. Topic: Clinical - Lab/Test Results >> Sep 30, 2023  3:40 PM Heather Willis wrote: Reason for CRM: Patient has an appointment for physical on 11/25/2023 and needs orders for labs. Please call the patient at [phone] to let her know the labs have been ordered.

## 2023-10-01 ENCOUNTER — Encounter: Payer: Medicare Other | Admitting: Internal Medicine

## 2023-10-01 NOTE — Telephone Encounter (Signed)
 Patient advised.

## 2023-10-13 DIAGNOSIS — K080 Exfoliation of teeth due to systemic causes: Secondary | ICD-10-CM | POA: Diagnosis not present

## 2023-10-21 DIAGNOSIS — F4322 Adjustment disorder with anxiety: Secondary | ICD-10-CM | POA: Diagnosis not present

## 2023-11-06 DIAGNOSIS — F4322 Adjustment disorder with anxiety: Secondary | ICD-10-CM | POA: Diagnosis not present

## 2023-11-25 ENCOUNTER — Encounter: Admitting: Internal Medicine

## 2023-12-03 DIAGNOSIS — F4322 Adjustment disorder with anxiety: Secondary | ICD-10-CM | POA: Diagnosis not present

## 2023-12-12 ENCOUNTER — Ambulatory Visit

## 2023-12-12 ENCOUNTER — Telehealth: Admitting: Family Medicine

## 2023-12-12 DIAGNOSIS — N3000 Acute cystitis without hematuria: Secondary | ICD-10-CM | POA: Diagnosis not present

## 2023-12-12 MED ORDER — NITROFURANTOIN MONOHYD MACRO 100 MG PO CAPS: 100.0000 mg | ORAL_CAPSULE | Freq: Two times a day (BID) | ORAL | 0 refills | Status: AC

## 2023-12-12 NOTE — Progress Notes (Signed)
 E-Visit for Urinary Problems  We are sorry that you are not feeling well.  Here is how we plan to help!  Based on what you shared with me it looks like you most likely have a simple urinary tract infection.  A UTI (Urinary Tract Infection) is a bacterial infection of the bladder.  Most cases of urinary tract infections are simple to treat but a key part of your care is to encourage you to drink plenty of fluids and watch your symptoms carefully.  I have prescribed macrobid. Your symptoms should gradually improve. Call us if the burning in your urine worsens, you develop worsening fever, back pain or pelvic pain or if your symptoms do not resolve after completing the antibiotic.  Urinary tract infections can be prevented by drinking plenty of water to keep your body hydrated.  Also be sure when you wipe, wipe from front to back and don't hold it in!  If possible, empty your bladder every 4 hours.  HOME CARE Drink plenty of fluids Compete the full course of the antibiotics even if the symptoms resolve Remember, when you need to go.go. Holding in your urine can increase the likelihood of getting a UTI! GET HELP RIGHT AWAY IF: You cannot urinate You get a high fever Worsening back pain occurs You see blood in your urine You feel sick to your stomach or throw up You feel like you are going to pass out  MAKE SURE YOU  Understand these instructions. Will watch your condition. Will get help right away if you are not doing well or get worse.   Thank you for choosing an e-visit.  Your e-visit answers were reviewed by a board certified advanced clinical practitioner to complete your personal care plan. Depending upon the condition, your plan could have included both over the counter or prescription medications.  Please review your pharmacy choice. Make sure the pharmacy is open so you can pick up prescription now. If there is a problem, you may contact your provider through [employer]  and have the prescription routed to another pharmacy.  Your safety is important to [place]. If you have drug allergies check your prescription carefully.   For the next 24 hours you can use MyChart to ask questions about today's visit, request a non-urgent call back, or ask for a work or school excuse. You will get an email in the next two days asking about your experience. I hope that your e-visit has been valuable and will speed your recovery.    have provided 5 minutes of non face to face time during this encounter for chart review and documentation.

## 2023-12-30 ENCOUNTER — Encounter: Admitting: Internal Medicine

## 2023-12-30 DIAGNOSIS — F4322 Adjustment disorder with anxiety: Secondary | ICD-10-CM | POA: Diagnosis not present

## 2024-01-27 ENCOUNTER — Ambulatory Visit

## 2024-05-20 ENCOUNTER — Ambulatory Visit

## 2024-05-31 ENCOUNTER — Other Ambulatory Visit (HOSPITAL_COMMUNITY): Payer: Self-pay

## 2024-05-31 ENCOUNTER — Encounter (HOSPITAL_COMMUNITY): Payer: Self-pay

## 2024-05-31 ENCOUNTER — Ambulatory Visit
Admission: RE | Admit: 2024-05-31 | Discharge: 2024-05-31 | Disposition: A | Discharge status: Home / Self Care | Source: Ambulatory Visit | Attending: Nurse Practitioner | Admitting: Nurse Practitioner

## 2024-05-31 ENCOUNTER — Telehealth: Admitting: Physician Assistant

## 2024-05-31 VITALS — BP 130/70 | HR 79 | Temp 98.4°F | Resp 16

## 2024-05-31 DIAGNOSIS — J029 Acute pharyngitis, unspecified: Secondary | ICD-10-CM | POA: Insufficient documentation

## 2024-05-31 DIAGNOSIS — R921 Mammographic calcification found on diagnostic imaging of breast: Secondary | ICD-10-CM

## 2024-05-31 DIAGNOSIS — J069 Acute upper respiratory infection, unspecified: Secondary | ICD-10-CM | POA: Insufficient documentation

## 2024-05-31 DIAGNOSIS — R6889 Other general symptoms and signs: Secondary | ICD-10-CM

## 2024-05-31 DIAGNOSIS — R059 Cough, unspecified: Secondary | ICD-10-CM | POA: Diagnosis not present

## 2024-05-31 LAB — POC COVID19/FLU A&B COMBO
Covid Antigen, POC: NEGATIVE
Influenza A Antigen, POC: NEGATIVE
Influenza B Antigen, POC: NEGATIVE

## 2024-05-31 LAB — POCT RAPID STREP A (OFFICE): Rapid Strep A Screen: NEGATIVE

## 2024-05-31 MED ORDER — FLUTICASONE PROPIONATE 50 MCG/ACT NA SUSP: 2.0000 | Freq: Every day | NASAL | 0 refills | Status: DC

## 2024-05-31 MED ORDER — CETIRIZINE HCL 10 MG PO TABS: 10.0000 mg | ORAL_TABLET | Freq: Every day | ORAL | 0 refills | Status: DC

## 2024-05-31 MED ORDER — PROMETHAZINE-DM 6.25-15 MG/5ML PO SYRP: 5.0000 mL | ORAL_SOLUTION | Freq: Every evening | ORAL | 0 refills | Status: DC | PRN

## 2024-05-31 NOTE — Progress Notes (Signed)
  Because of your symptoms, I feel your condition warrants further evaluation and I recommend that you be seen in a face-to-face visit. This way a proper physical exam can be done.    NOTE: There will be NO CHARGE for this E-Visit   If you are having a true medical emergency, please call 911.     For an urgent face to face visit, [facility] has multiple urgent care centers for your convenience.  Click the link below for the full list of locations and hours, walk-in wait times, appointment scheduling options and driving directions:  Urgent Care - [place], [place], [place], [place], [place], [place]  [place]     Your MyChart E-visit questionnaire answers were reviewed by a board certified advanced clinical practitioner to complete your personal care plan based on your specific symptoms.    Thank you for using e-Visits.

## 2024-05-31 NOTE — ED Provider Notes (Signed)
 [facility]    CSN: [id number] Arrival date & time: 05/31/24  9062      History   Chief Complaint Chief Complaint  Patient presents with   Cough    sore throat, cough, nasal congestion, aches muscles, do not know temperature but low grade likely - Entered by patient   Nasal Congestion    HPI Heather Willis is a 68 y.o. female.   The history is provided by the patient.   Patient presents with a 2-day history of sore throat, headache, cough, sinus pressure, and nasal congestion.  Patient denies fever, but states that she does not have a working thermometer at home, ear drainage, wheezing, difficulty breathing, abdominal pain, nausea, vomiting, diarrhea, or rash.  Patient states that she has been around several people, but no obvious close sick contacts.  States she has been taking over-the-counter Mucinex and vitamin C for her symptoms.  Past Medical History:  Diagnosis Date   Arthritis    Closed left ankle fracture 2018   Fracture of left ankle, lateral malleolus 04/07/2017   Fractures    Leg wound, left, subsequent encounter 03/26/2022   Psoriatic arthritis [facility])     Patient Active Problem List   Diagnosis Date Noted   History of psoriatic arthritis 03/26/2023   Stress due to illness of family member 03/26/2023   Need for influenza vaccination 03/26/2023   Prediabetes 09/25/2022   Encounter for well adult exam with abnormal findings 06/27/2022   Hyperlipidemia 06/27/2022   Osteopenia 02/21/2022   Abnormal mammogram 02/21/2022   Obesity 01/24/2022   Post-menopausal 01/24/2022   Encounter for screening mammogram for malignant neoplasm of breast 01/24/2022   Screening for colon cancer 01/24/2022    Past Surgical History:  Procedure Laterality Date   ABDOMINAL HERNIA REPAIR     18 yrs ago   CHOLECYSTECTOMY     HYSTERECTOMY ABDOMINAL WITH SALPINGECTOMY     at age 71   ORIF ANKLE FRACTURE Left 04/09/2017   Procedure: OPEN REDUCTION INTERNAL FIXATION  (ORIF) LEFT ANKLE FRACTURE;  Surgeon: Jerri Kay HERO, MD;  Location: [facility];  Service: Orthopedics;  Laterality: Left;   REDUCTION MAMMAPLASTY      OB History   No obstetric history on file.      Home Medications    Prior to Admission medications   Medication Sig Start Date End Date Taking? Authorizing Provider  Calcium  Carb-Cholecalciferol  (CALTRATE 600+D3) 600-20 MG-MCG TABS Take 600 mg by mouth 2 (two) times daily. 02/07/22  Yes Paseda, Folashade R, FNP  cetirizine (ZYRTEC) 10 MG tablet Take 1 tablet (10 mg total) by mouth daily. 05/31/24  Yes Leath-Warren, Etta PARAS, NP  fluticasone (FLONASE) 50 MCG/ACT nasal spray Place 2 sprays into both nostrils daily. 05/31/24  Yes Leath-Warren, Etta PARAS, NP  promethazine -dextromethorphan (PROMETHAZINE -DM) 6.25-15 MG/5ML syrup Take 5 mLs by mouth at bedtime as needed. 05/31/24  Yes Leath-Warren, Etta PARAS, NP  Digestive Enzymes (SUPER ENZYMES) TABS Take by mouth.    [provider]  Magnesium Sulfate 2000 MG/3.2ML SOLN Take 1 tablet by mouth daily.    [provider]    Family History Family History  Problem Relation Age of Onset   Parkinsonism Mother    Throat cancer Father    Atrial fibrillation Father    Heart disease Father    Macular degeneration Father    Diabetes Sister    Atrial fibrillation Sister    Pancreatic cancer Sister    Breast cancer Neg Hx  Colon cancer Neg Hx     Social History Social History   Tobacco Use   Smoking status: Former    Current packs/day: 0.00    Types: Cigarettes    Quit date: 1998    Years since quitting: 27.8   Smokeless tobacco: Never  Substance Use Topics   Alcohol use: No   Drug use: No     Allergies   Penicillins   Review of Systems Review of Systems Per HPI  Physical Exam Triage Vital Signs ED Triage Vitals  Encounter Vitals Group     BP 05/31/24 0947 130/70     Girls Systolic BP Percentile --      Girls Diastolic BP Percentile --       Boys Systolic BP Percentile --      Boys Diastolic BP Percentile --      Pulse Rate 05/31/24 0947 79     Resp 05/31/24 0947 16     Temp 05/31/24 0947 98.4 F (36.9 C)     Temp Source 05/31/24 0947 Oral     SpO2 05/31/24 0947 95 %     Weight --      Height --      Head Circumference --      Peak Flow --      Pain Score 05/31/24 0950 6     Pain Loc --      Pain Education --      Exclude from Growth Chart --    No data found.  Updated Vital Signs BP 130/70 (BP Location: Right Arm)   Pulse 79   Temp 98.4 F (36.9 C) (Oral)   Resp 16   SpO2 95%   Visual Acuity Right Eye Distance:   Left Eye Distance:   Bilateral Distance:    Right Eye Near:   Left Eye Near:    Bilateral Near:     Physical Exam Vitals and nursing note reviewed.  Constitutional:      General: She is not in acute distress.    Appearance: Normal appearance. She is well-developed.  HENT:     Head: Normocephalic and atraumatic.     Right Ear: Tympanic membrane, ear canal and external ear normal.     Left Ear: Tympanic membrane, ear canal and external ear normal.     Nose: Congestion present.     Right Turbinates: Enlarged and swollen.     Left Turbinates: Enlarged and swollen.     Right Sinus: No maxillary sinus tenderness or frontal sinus tenderness.     Left Sinus: No maxillary sinus tenderness or frontal sinus tenderness.     Mouth/Throat:     Lips: Pink.     Mouth: Mucous membranes are moist.     Pharynx: Uvula midline. Posterior oropharyngeal erythema and postnasal drip present. No pharyngeal swelling, oropharyngeal exudate or uvula swelling.     Comments: Cobblestoning present to posterior oropharynx  Eyes:     Extraocular Movements: Extraocular movements intact.     Conjunctiva/sclera: Conjunctivae normal.     Pupils: Pupils are equal, round, and reactive to light.  Neck:     Thyroid : No thyromegaly.     Trachea: No tracheal deviation.  Cardiovascular:     Rate and Rhythm: Normal rate  and regular rhythm.     Pulses: Normal pulses.     Heart sounds: Normal heart sounds.  Pulmonary:     Effort: Pulmonary effort is normal. No respiratory distress.     Breath sounds: Normal breath  sounds. No stridor. No wheezing, rhonchi or rales.  Abdominal:     General: Bowel sounds are normal.     Palpations: Abdomen is soft.  Musculoskeletal:     Cervical back: Normal range of motion and neck supple.  Skin:    General: Skin is warm and dry.  Neurological:     General: No focal deficit present.     Mental Status: She is alert and oriented to person, place, and time.  Psychiatric:        Mood and Affect: Mood normal.        Behavior: Behavior normal.        Thought Content: Thought content normal.        Judgment: Judgment normal.      UC Treatments / Results  Labs (all labs ordered are listed, but only abnormal results are displayed) Labs Reviewed  CULTURE, GROUP A STREP ([facility])  POC COVID19/FLU A&B COMBO  POCT RAPID STREP A (OFFICE)    EKG   Radiology No results found.  Procedures Procedures (including critical care time)  Medications Ordered in UC Medications - No data to display  Initial Impression / Assessment and Plan / UC Course  I have reviewed the triage vital signs and the nursing notes.  Pertinent labs & imaging results that were available during my care of the patient were reviewed by me and considered in my medical decision making (see chart for details).  The COVID/flu test and rapid strep test were negative.  A throat culture is pending.  On exam, the patient's lung sounds are clear throughout, room air sats are at 95%.  The patient is well-appearing, she is in no acute distress, vital signs are stable.  Symptoms are consistent with a viral URI with cough.  Will provide symptomatic treatment with Promethazine  DM for the cough, fluticasone 50 micro nasal spray for nasal congestion, and cetirizine 10 mg.  Supportive care recommendations were provided  discussed with the patient to include fluids, rest, over-the-counter Tylenol , warm salt water gargles, and use of a humidifier.  Discussed indications with patient regarding follow-up.  Patient was in agreement with this plan of care and verbalizes understanding.  All questions were answered.  Patient stable for discharge.  Final Clinical Impressions(s) / UC Diagnoses   Final diagnoses:  Cough, unspecified type  Viral URI with cough  Sore throat     Discharge Instructions      The COVID/flu test and rapid strep test were negative.  A throat culture has been ordered.  You will be contacted if the pending test result is abnormal.  You will also have access to the results via MyChart. Take medication as prescribed. You may take over-the-counter Tylenol  as needed for pain, fever, or general discomfort. Recommend warm salt water gargles 3-4 times daily as needed for throat pain or discomfort.  Also recommend over-the-counter Chloraseptic throat spray or throat lozenges while symptoms persist. For your cough, you may find it helpful to use a humidifier in your bedroom at nighttime during sleep and to sleep elevated on pillows while cough symptoms persist. As discussed, your symptoms should begin to improve over the next 5 to 7 days.  If your symptoms fail to improve, or begin to worsen, you may follow-up in this clinic or with your primary care physician for further evaluation. Follow-up as needed.     ED Prescriptions     Medication Sig Dispense Auth. Provider   promethazine -dextromethorphan (PROMETHAZINE -DM) 6.25-15 MG/5ML syrup Take 5 mLs by  mouth at bedtime as needed. 100 mL Leath-Warren, Etta PARAS, NP   fluticasone (FLONASE) 50 MCG/ACT nasal spray Place 2 sprays into both nostrils daily. 16 g Leath-Warren, Etta PARAS, NP   cetirizine (ZYRTEC) 10 MG tablet Take 1 tablet (10 mg total) by mouth daily. 30 tablet Leath-Warren, Etta PARAS, NP      PDMP not reviewed this encounter.    Gilmer Etta PARAS, NP 05/31/24 1009

## 2024-05-31 NOTE — Discharge Instructions (Addendum)
 The COVID/flu test and rapid strep test were negative.  A throat culture has been ordered.  You will be contacted if the pending test result is abnormal.  You will also have access to the results via MyChart. Take medication as prescribed. You may take over-the-counter Tylenol  as needed for pain, fever, or general discomfort. Recommend warm salt water gargles 3-4 times daily as needed for throat pain or discomfort.  Also recommend over-the-counter Chloraseptic throat spray or throat lozenges while symptoms persist. For your cough, you may find it helpful to use a humidifier in your bedroom at nighttime during sleep and to sleep elevated on pillows while cough symptoms persist. As discussed, your symptoms should begin to improve over the next 5 to 7 days.  If your symptoms fail to improve, or begin to worsen, you may follow-up in this clinic or with your primary care physician for further evaluation. Follow-up as needed.

## 2024-05-31 NOTE — ED Triage Notes (Signed)
 Sore throat, headache, cough, congestion, sinus pain and pressure x 2 days. Taking mucinex and vit C.   Took a home covid test yesterday and it was negative.

## 2024-06-02 ENCOUNTER — Encounter (HOSPITAL_COMMUNITY)

## 2024-06-02 ENCOUNTER — Encounter (HOSPITAL_COMMUNITY): Payer: Self-pay

## 2024-06-03 LAB — CULTURE, GROUP A STREP (THRC)

## 2024-06-04 ENCOUNTER — Ambulatory Visit (HOSPITAL_COMMUNITY): Payer: Self-pay

## 2024-06-10 ENCOUNTER — Encounter (HOSPITAL_COMMUNITY): Payer: Self-pay

## 2024-06-10 ENCOUNTER — Ambulatory Visit (HOSPITAL_COMMUNITY)
Admission: RE | Admit: 2024-06-10 | Discharge: 2024-06-10 | Disposition: A | Discharge status: Home / Self Care | Source: Ambulatory Visit

## 2024-06-10 DIAGNOSIS — N6322 Unspecified lump in the left breast, upper inner quadrant: Secondary | ICD-10-CM | POA: Diagnosis not present

## 2024-06-10 DIAGNOSIS — R921 Mammographic calcification found on diagnostic imaging of breast: Secondary | ICD-10-CM | POA: Insufficient documentation

## 2024-06-10 DIAGNOSIS — R92323 Mammographic fibroglandular density, bilateral breasts: Secondary | ICD-10-CM | POA: Diagnosis not present

## 2024-06-10 DIAGNOSIS — R928 Other abnormal and inconclusive findings on diagnostic imaging of breast: Secondary | ICD-10-CM | POA: Diagnosis not present

## 2024-06-30 ENCOUNTER — Ambulatory Visit

## 2024-06-30 VITALS — Ht 64.0 in | Wt 185.0 lb

## 2024-06-30 DIAGNOSIS — Z780 Asymptomatic menopausal state: Secondary | ICD-10-CM | POA: Diagnosis not present

## 2024-06-30 DIAGNOSIS — Z0000 Encounter for general adult medical examination without abnormal findings: Secondary | ICD-10-CM

## 2024-06-30 NOTE — Progress Notes (Signed)
 Chief Complaint  Patient presents with   Medicare Wellness     Subjective:   Heather Willis is a 68 y.o. female who presents for a Medicare Annual Wellness Visit.  Visit info / Clinical Intake: Medicare Wellness Visit Type:: Subsequent Annual Wellness Visit Persons participating in visit and providing information:: patient Medicare Wellness Visit Mode:: Video Since this visit was completed virtually, some vitals may be partially provided or unavailable. Missing vitals are due to the limitations of the virtual format.: Documented vitals are patient reported If Telephone or Video please confirm:: I connected with patient using audio/video enable telemedicine. I verified patient identity with two identifiers, discussed telehealth limitations, and patient agreed to proceed. Patient Location:: home Provider Location:: home office Interpreter Needed?: No Pre-visit prep was completed: yes AWV questionnaire completed by patient prior to visit?: yes Date:: 06/30/24 Living arrangements:: (!) (Patient-Rptd) lives alone Patient's Overall Health Status Rating: (Patient-Rptd) very good Typical amount of pain: (Patient-Rptd) none Does pain affect daily life?: (Patient-Rptd) no Are you currently prescribed opioids?: no  Dietary Habits and Nutritional Risks How many meals a day?: (Patient-Rptd) 2 Eats fruit and vegetables daily?: (Patient-Rptd) yes Most meals are obtained by: (Patient-Rptd) preparing own meals In the last 2 weeks, have you had any of the following?: none Diabetic:: no  Functional Status Activities of Daily Living (to include ambulation/medication): (Patient-Rptd) Independent Ambulation: (Patient-Rptd) Independent Medication Administration: (Patient-Rptd) Independent Home Management (perform basic housework or laundry): (Patient-Rptd) Independent Manage your own finances?: (Patient-Rptd) yes Primary transportation is: (Patient-Rptd) driving Concerns about vision?: no *vision  screening is required for WTM* Concerns about hearing?: no  Fall Screening Falls in the past year?: (Patient-Rptd) 0 Number of falls in past year: 0 Was there an injury with Fall?: 0 Fall Risk Category Calculator: 0 Patient Fall Risk Level: Low Fall Risk  Fall Risk Patient at Risk for Falls Due to: No Fall Risks Fall risk Follow up: Falls evaluation completed; Education provided; Falls prevention discussed  Home and Transportation Safety: All rugs have non-skid backing?: (Patient-Rptd) yes All stairs or steps have railings?: (Patient-Rptd) yes Grab bars in the bathtub or shower?: (Patient-Rptd) yes Have non-skid surface in bathtub or shower?: (Patient-Rptd) yes Good home lighting?: (Patient-Rptd) yes Regular seat belt use?: (Patient-Rptd) yes Hospital stays in the last year:: (Patient-Rptd) no  Cognitive Assessment Difficulty concentrating, remembering, or making decisions? : (Patient-Rptd) no Will 6CIT or Mini Cog be Completed: no 6CIT or Mini Cog Declined: patient alert, oriented, able to answer questions appropriately and recall recent events  Advance Directives (For Healthcare) Does Patient Have a Medical Advance Directive?: No Would patient like information on creating a medical advance directive?: No - Patient declined  Reviewed/Updated  Reviewed/Updated: Reviewed All (Medical, Surgical, Family, Medications, Allergies, Care Teams, Patient Goals)    Allergies (verified) Penicillins   Current Medications (verified) Outpatient Encounter Medications as of 06/30/2024  Medication Sig   Calcium  Carb-Cholecalciferol  (CALTRATE 600+D3) 600-20 MG-MCG TABS Take 600 mg by mouth 2 (two) times daily.   cetirizine  (ZYRTEC ) 10 MG tablet Take 1 tablet (10 mg total) by mouth daily.   fluticasone  (FLONASE ) 50 MCG/ACT nasal spray Place 2 sprays into both nostrils daily.   promethazine -dextromethorphan (PROMETHAZINE -DM) 6.25-15 MG/5ML syrup Take 5 mLs by mouth at bedtime as needed.    [DISCONTINUED] Digestive Enzymes (SUPER ENZYMES) TABS Take by mouth.   [DISCONTINUED] Magnesium Sulfate 2000 MG/3.2ML SOLN Take 1 tablet by mouth daily.   No facility-administered encounter medications on file as of 06/30/2024.    History: Past  Medical History:  Diagnosis Date   Allergy    Arthritis    Closed left ankle fracture 2018   Fracture of left ankle, lateral malleolus 04/07/2017   Fractures    Leg wound, left, subsequent encounter 03/26/2022   Psoriatic arthritis (HCC)    Past Surgical History:  Procedure Laterality Date   ABDOMINAL HERNIA REPAIR     18 yrs ago   ABDOMINAL HYSTERECTOMY     BREAST BIOPSY Left 2005   benign   CHOLECYSTECTOMY     HYSTERECTOMY ABDOMINAL WITH SALPINGECTOMY     at age 56   ORIF ANKLE FRACTURE Left 04/09/2017   Procedure: OPEN REDUCTION INTERNAL FIXATION (ORIF) LEFT ANKLE FRACTURE;  Surgeon: Jerri Kay HERO, MD;  Location: [facility];  Service: Orthopedics;  Laterality: Left;   REDUCTION MAMMAPLASTY  1990   Family History  Problem Relation Age of Onset   Parkinsonism Mother    Throat cancer Father    Atrial fibrillation Father    Heart disease Father    Macular degeneration Father    Diabetes Sister    Atrial fibrillation Sister    Heart disease Sister    Obesity Sister    Pancreatic cancer Sister    Breast cancer Neg Hx    Colon cancer Neg Hx    Social History   Occupational History   Not on file  Tobacco Use   Smoking status: Former    Current packs/day: 0.00    Types: Cigarettes    Quit date: 1998    Years since quitting: 27.9   Smokeless tobacco: Never  Substance and Sexual Activity   Alcohol use: Never   Drug use: Never   Sexual activity: Not Currently   Tobacco Counseling Counseling given: Yes  SDOH Screenings   Food Insecurity: No Food Insecurity (06/30/2024)  Housing: Low Risk  (06/30/2024)  Transportation Needs: No Transportation Needs (06/30/2024)  Utilities: Not At Risk (06/30/2024)  Alcohol  Screen: Low Risk  (04/18/2022)  Depression (PHQ2-9): Low Risk  (06/30/2024)  Financial Resource Strain: Low Risk  (04/18/2022)  Physical Activity: Sufficiently Active (06/30/2024)  Social Connections: Socially Isolated (06/30/2024)  Stress: No Stress Concern Present (06/30/2024)  Tobacco Use: Medium Risk (06/30/2024)  Health Literacy: Adequate Health Literacy (06/30/2024)   See flowsheets for full screening details  Depression Screen PHQ 2 & 9 Depression Scale- Over the past 2 weeks, how often have you been bothered by any of the following problems? Little interest or pleasure in doing things: 0 Feeling down, depressed, or hopeless (PHQ Adolescent also includes...irritable): 0 PHQ-2 Total Score: 0     Goals Addressed               This Visit's Progress     Lose weight and become more active outside of work (pt-stated)               Objective:    Today's Vitals   06/30/24 0805  Weight: 185 lb (83.9 kg)  Height: 5' 4 (1.626 m)   Body mass index is 31.76 kg/m.  Hearing/Vision screen Hearing Screening - Comments:: Patient denies any hearing difficulties.   Vision Screening - Comments:: Wears rx glasses - up to date with routine eye exams Immunizations and Health Maintenance Health Maintenance  Topic Date Due   Pneumococcal Vaccine: 50+ Years (1 of 1 - PCV) Never done   Zoster Vaccines- Shingrix (1 of 2) Never done   Medicare Annual Wellness (AWV)  04/19/2023   Bone Density Scan  02/07/2024   Influenza Vaccine  02/27/2024   COVID-19 Vaccine (5 - 2025-26 season) 03/29/2024   Fecal DNA (Cologuard)  02/05/2025   Mammogram  06/10/2025   DTaP/Tdap/Td (2 - Td or Tdap) 03/19/2032   Hepatitis C Screening  Completed   Meningococcal B Vaccine  Aged Out        Assessment/Plan:  This is a routine wellness examination for [place].  Patient Care Team: Bevely Doffing, FNP as PCP - General (Family Medicine)  I have personally reviewed and noted the following in the patient's  chart:   Medical and social history Use of alcohol, tobacco or illicit drugs  Current medications and supplements including opioid prescriptions. Functional ability and status Nutritional status Physical activity Advanced directives List of other physicians Hospitalizations, surgeries, and ER visits in previous 12 months Vitals Screenings to include cognitive, depression, and falls Referrals and appointments  Orders Placed This Encounter  Procedures   DG Bone Density    Standing Status:   Future    Expiration Date:   06/30/2025    Reason for Exam (SYMPTOM  OR DIAGNOSIS REQUIRED):   post menopausal estrogen deficient    Preferred imaging location?:   [facility]   In addition, I have reviewed and discussed with patient certain preventive protocols, quality metrics, and best practice recommendations. A written personalized care plan for preventive services as well as general preventive health recommendations were provided to patient.   Deloria Brassfield, CMA   06/30/2024   No follow-ups on file.  After Visit Summary: (MyChart) Due to this being a telephonic visit, the after visit summary with patients personalized plan was offered to patient via MyChart   Nurse Notes: n/a

## 2024-06-30 NOTE — Patient Instructions (Signed)
 Heather Willis,  Thank you for taking the time for your Medicare Wellness Visit. I appreciate your continued commitment to your health goals. Please review the care plan we discussed, and feel free to reach out if I can assist you further.  Please note that Annual Wellness Visits do not include a physical exam. Some assessments may be limited, especially if the visit was conducted virtually. If needed, we may recommend an in-person follow-up with your provider.  Referrals If a referral was made during today's visit and you haven't received any updates within two weeks, please contact the referred provider directly to check on the status.  Osteoporosis Screening An order was placed for you to have your Osteoporosis Screening. Call the number below to schedule that [facility]  [phone]   Ongoing Care Seeing your primary care provider every 3 to 6 months helps us  monitor your health and provide consistent, personalized care.   1 year follow up with wellness nurse: July 01, 2025 at 2:30 pm in office  Recommended Screenings:  Health Maintenance  Topic Date Due   Pneumococcal Vaccine for age over 60 (1 of 1 - PCV) Never done   Zoster (Shingles) Vaccine (1 of 2) Never done   Osteoporosis screening with Bone Density Scan  02/07/2024   Flu Shot  02/27/2024   COVID-19 Vaccine (5 - 2025-26 season) 03/29/2024   Cologuard (Stool DNA test)  02/05/2025   Breast Cancer Screening  06/10/2025   Medicare Annual Wellness Visit  06/30/2025   DTaP/Tdap/Td vaccine (2 - Td or Tdap) 03/19/2032   Hepatitis C Screening  Completed   Meningitis B Vaccine  Aged Out       06/30/2024    7:52 AM  Advanced Directives  Does Patient Have a Medical Advance Directive? No  Would patient like information on creating a medical advance directive? No - Patient declined    Vision: Annual vision screenings are recommended for early detection of glaucoma, cataracts, and diabetic retinopathy. These exams can also  reveal signs of chronic conditions such as diabetes and high blood pressure.  Dental: Annual dental screenings help detect early signs of oral cancer, gum disease, and other conditions linked to overall health, including heart disease and diabetes.  Please see the attached documents for additional preventive care recommendations.

## 2024-07-06 ENCOUNTER — Other Ambulatory Visit (HOSPITAL_COMMUNITY)

## 2024-07-14 ENCOUNTER — Ambulatory Visit

## 2024-07-14 ENCOUNTER — Inpatient Hospital Stay (HOSPITAL_COMMUNITY): Admission: RE | Admit: 2024-07-14 | Discharge: 2024-07-14

## 2024-07-14 DIAGNOSIS — Z780 Asymptomatic menopausal state: Secondary | ICD-10-CM | POA: Diagnosis not present

## 2024-07-24 LAB — TSH+FREE T4
Free T4: 1.15 ng/dL (ref 0.82–1.77)
TSH: 1.29 u[IU]/mL (ref 0.450–4.500)

## 2024-07-24 LAB — CMP14+EGFR
ALT: 20 IU/L (ref 0–32)
AST: 16 IU/L (ref 0–40)
Albumin: 4.2 g/dL (ref 3.9–4.9)
Alkaline Phosphatase: 71 IU/L (ref 49–135)
BUN/Creatinine Ratio: 19 (ref 12–28)
BUN: 12 mg/dL (ref 8–27)
Bilirubin Total: 0.4 mg/dL (ref 0.0–1.2)
CO2: 24 mmol/L (ref 20–29)
Calcium: 9.2 mg/dL (ref 8.7–10.3)
Chloride: 106 mmol/L (ref 96–106)
Creatinine, Ser: 0.63 mg/dL (ref 0.57–1.00)
Globulin, Total: 2.2 g/dL (ref 1.5–4.5)
Glucose: 107 mg/dL — ABNORMAL HIGH (ref 70–99)
Potassium: 4.1 mmol/L (ref 3.5–5.2)
Sodium: 142 mmol/L (ref 134–144)
Total Protein: 6.4 g/dL (ref 6.0–8.5)
eGFR: 96 mL/min/1.73

## 2024-07-24 LAB — CBC WITH DIFFERENTIAL/PLATELET
Basophils Absolute: 0 x10E3/uL (ref 0.0–0.2)
Basos: 1 %
EOS (ABSOLUTE): 0.3 x10E3/uL (ref 0.0–0.4)
Eos: 5 %
Hematocrit: 38.1 % (ref 34.0–46.6)
Hemoglobin: 12.6 g/dL (ref 11.1–15.9)
Immature Grans (Abs): 0 x10E3/uL (ref 0.0–0.1)
Immature Granulocytes: 0 %
Lymphocytes Absolute: 2.4 x10E3/uL (ref 0.7–3.1)
Lymphs: 42 %
MCH: 29.2 pg (ref 26.6–33.0)
MCHC: 33.1 g/dL (ref 31.5–35.7)
MCV: 88 fL (ref 79–97)
Monocytes Absolute: 0.4 x10E3/uL (ref 0.1–0.9)
Monocytes: 7 %
Neutrophils Absolute: 2.7 x10E3/uL (ref 1.4–7.0)
Neutrophils: 45 %
Platelets: 291 x10E3/uL (ref 150–450)
RBC: 4.31 x10E6/uL (ref 3.77–5.28)
RDW: 13 % (ref 11.7–15.4)
WBC: 5.8 x10E3/uL (ref 3.4–10.8)

## 2024-07-24 LAB — B12 AND FOLATE PANEL
Folate: 12 ng/mL
Vitamin B-12: 495 pg/mL (ref 232–1245)

## 2024-07-24 LAB — LIPID PANEL
Chol/HDL Ratio: 3.2 ratio (ref 0.0–4.4)
Cholesterol, Total: 225 mg/dL — ABNORMAL HIGH (ref 100–199)
HDL: 70 mg/dL
LDL Chol Calc (NIH): 130 mg/dL — ABNORMAL HIGH (ref 0–99)
Triglycerides: 143 mg/dL (ref 0–149)
VLDL Cholesterol Cal: 25 mg/dL (ref 5–40)

## 2024-07-24 LAB — HEMOGLOBIN A1C
Est. average glucose Bld gHb Est-mCnc: 131 mg/dL
Hgb A1c MFr Bld: 6.2 % — ABNORMAL HIGH (ref 4.8–5.6)

## 2024-07-24 LAB — VITAMIN D 25 HYDROXY (VIT D DEFICIENCY, FRACTURES): Vit D, 25-Hydroxy: 19.9 ng/mL — ABNORMAL LOW (ref 30.0–100.0)

## 2024-07-26 ENCOUNTER — Ambulatory Visit

## 2024-07-26 VITALS — BP 140/78 | HR 71 | Ht 64.0 in | Wt 219.1 lb

## 2024-07-26 DIAGNOSIS — E559 Vitamin D deficiency, unspecified: Secondary | ICD-10-CM | POA: Diagnosis not present

## 2024-07-26 DIAGNOSIS — Z0001 Encounter for general adult medical examination with abnormal findings: Secondary | ICD-10-CM | POA: Diagnosis not present

## 2024-07-26 DIAGNOSIS — M85851 Other specified disorders of bone density and structure, right thigh: Secondary | ICD-10-CM

## 2024-07-26 DIAGNOSIS — E66812 Obesity, class 2: Secondary | ICD-10-CM

## 2024-07-26 DIAGNOSIS — Z6837 Body mass index (BMI) 37.0-37.9, adult: Secondary | ICD-10-CM

## 2024-07-26 DIAGNOSIS — R7303 Prediabetes: Secondary | ICD-10-CM

## 2024-07-26 DIAGNOSIS — E782 Mixed hyperlipidemia: Secondary | ICD-10-CM | POA: Diagnosis not present

## 2024-07-26 DIAGNOSIS — E669 Obesity, unspecified: Secondary | ICD-10-CM

## 2024-07-26 DIAGNOSIS — Z0000 Encounter for general adult medical examination without abnormal findings: Secondary | ICD-10-CM

## 2024-07-26 MED ORDER — WEGOVY 0.25 MG/0.5ML ~~LOC~~ SOAJ: 0.2500 mg | SUBCUTANEOUS | 1 refills | Status: DC

## 2024-07-26 NOTE — Progress Notes (Unsigned)
" ° °  Complete physical exam  Patient: Heather Willis   DOB: 04/15/1956   68 y.o. Female  MRN: [id number]  Subjective:    Chief Complaint  Patient presents with   Annual Exam    Heather Willis is a 68 y.o. female who presents today for a complete physical exam. She reports consuming a {diet types:17450} diet. {types:19826} She generally feels {DESC; WELL/FAIRLY WELL/POORLY:18703}. She reports sleeping {DESC; WELL/FAIRLY WELL/POORLY:18703}. She {does/does not:200015} have additional problems to discuss today.    Most recent fall risk assessment:    06/30/2024    7:52 AM  Fall Risk   Falls in the past year? 0   Number falls in past yr: 0  Injury with Fall? 0  Risk for fall due to : No Fall Risks  Follow up Falls evaluation completed;Education provided;Falls prevention discussed     Manually entered by patient     Most recent depression screenings:    07/26/2024    3:28 PM 06/30/2024    8:15 AM  PHQ 2/9 Scores  PHQ - 2 Score 0 0  PHQ- 9 Score 4     {VISON DENTAL STD PSA (Optional):27386}  {History (Optional):23778}  Patient Care Team: Bevely Doffing, FNP as PCP - General (Family Medicine)   Show/hide medication list[1]  ROS        Objective:     BP (!) 148/78   Pulse 71   Ht 5' 4 (1.626 m)   Wt 219 lb 1.3 oz (99.4 kg)   SpO2 97%   BMI 37.60 kg/m  {Vitals History (Optional):23777}  Physical Exam   No results found for any visits on 07/26/24. {Show previous labs (optional):23779}    Assessment & Plan:    Routine Health Maintenance and Physical Exam  Immunization History  Administered Date(s) Administered   Fluad Trivalent(High Dose 65+) 03/26/2023   Influenza,inj,Quad PF,6+ Mos 04/30/2017   Influenza-Unspecified 04/30/2017   Moderna SARS-COV2 Booster Vaccination 08/08/2020, 04/26/2021   PFIZER(Purple Top)SARS-COV-2 Vaccination 09/27/2019, 10/28/2019   Tdap 03/19/2022    Health Maintenance  Topic Date Due   Pneumococcal Vaccine: 50+ Years  (1 of 1 - PCV) Never done   Zoster Vaccines- Shingrix (1 of 2) Never done   Influenza Vaccine  02/27/2024   COVID-19 Vaccine (5 - 2025-26 season) 03/29/2024   Fecal DNA (Cologuard)  02/05/2025   Mammogram  06/10/2025   Medicare Annual Wellness (AWV)  06/30/2025   Bone Density Scan  07/14/2026   DTaP/Tdap/Td (2 - Td or Tdap) 03/19/2032   Hepatitis C Screening  Completed   Meningococcal B Vaccine  Aged Out    Discussed health benefits of physical activity, and encouraged her to engage in regular exercise appropriate for her age and condition.  Problem List Items Addressed This Visit   None  No follow-ups on file.     Doffing Bevely, FNP        [1] Outpatient Medications Prior to Visit  Medication Sig   Calcium  Carb-Cholecalciferol  (CALTRATE 600+D3) 600-20 MG-MCG TABS Take 600 mg by mouth 2 (two) times daily.   cetirizine  (ZYRTEC ) 10 MG tablet Take 1 tablet (10 mg total) by mouth daily. (Patient not taking: Reported on 07/26/2024)   fluticasone  (FLONASE ) 50 MCG/ACT nasal spray Place 2 sprays into both nostrils daily. (Patient not taking: Reported on 07/26/2024)   promethazine -dextromethorphan (PROMETHAZINE -DM) 6.25-15 MG/5ML syrup Take 5 mLs by mouth at bedtime as needed. (Patient not taking: Reported on 07/26/2024)   No facility-administered medications prior to visit.  "

## 2024-07-27 ENCOUNTER — Other Ambulatory Visit (HOSPITAL_COMMUNITY): Payer: Self-pay

## 2024-07-28 ENCOUNTER — Telehealth: Payer: Self-pay | Admitting: Pharmacy Technician

## 2024-07-28 ENCOUNTER — Telehealth: Payer: Self-pay

## 2024-07-28 DIAGNOSIS — E559 Vitamin D deficiency, unspecified: Secondary | ICD-10-CM | POA: Insufficient documentation

## 2024-07-28 NOTE — Assessment & Plan Note (Signed)
 Cholesterol levels elevated, HDL improved. She declined cholesterol medication. - Advised on dietary changes and increased physical activity.

## 2024-07-28 NOTE — Assessment & Plan Note (Signed)
 Discussed GLP-1 receptor agonists like Wegovy for weight loss and inflammation reduction. Explained potential side effects such as nausea and diarrhea. - Prescribed [id number]. - Advised on dietary changes and increased physical activity.

## 2024-07-28 NOTE — Assessment & Plan Note (Signed)
 Vitamin D  levels remain low. Not taking supplements. - Encouraged taking vitamin D  supplements. - Advised on increasing sun exposure.

## 2024-07-28 NOTE — Telephone Encounter (Signed)
 PA request has been Started. New Encounter has been or will be created for follow up. For additional info see Pharmacy Prior Auth telephone encounter from 07/28/2024.

## 2024-07-28 NOTE — Assessment & Plan Note (Signed)
 Blood sugar levels remain in the prediabetic range. Emphasized lifestyle modifications to prevent diabetes progression. - Advised on dietary changes and increased physical activity.

## 2024-07-28 NOTE — Assessment & Plan Note (Signed)
 Bone density stable. Not taking vitamin D  supplements, which may aid calcium  absorption. - Encouraged taking vitamin D  supplements.

## 2024-07-28 NOTE — Telephone Encounter (Signed)
 Copied from CRM #[id number]. Topic: Clinical - Medication Prior Auth >> Jul 28, 2024 11:10 AM Tinnie BROCKS wrote: Reason for CRM: Chiquita with [facility] is calling to get clinical for a prior auth request on wegovy. She will be faxing this request over now. Fax # [email or web address]. Please keep an eye out.

## 2024-07-28 NOTE — Telephone Encounter (Signed)
 Pharmacy Patient Advocate Encounter   Received notification from Pt Calls Messages that prior authorization for Wegovy 0.25MG /0.5ML auto-injectors is required/requested.   Insurance verification completed.   The patient is insured through [employer] [place] [employer].   Per test claim: PA required; PA submitted to above mentioned insurance via Latent Key/confirmation #/EOC [id number] Status is pending

## 2024-08-02 NOTE — Telephone Encounter (Signed)
 Pharmacy Patient Advocate Encounter  Received notification from [employer] [place] [employer] that Prior Authorization for Wegovy  0.25MG /0.5ML auto-injectors has been DENIED.  Full denial letter will be uploaded to the media tab. See denial reason below.

## 2024-08-10 ENCOUNTER — Ambulatory Visit (INDEPENDENT_AMBULATORY_CARE_PROVIDER_SITE_OTHER)

## 2024-08-10 ENCOUNTER — Ambulatory Visit
Admission: RE | Admit: 2024-08-10 | Discharge: 2024-08-10 | Disposition: A | Discharge status: Home / Self Care | Source: Ambulatory Visit | Attending: Nurse Practitioner | Admitting: Nurse Practitioner

## 2024-08-10 ENCOUNTER — Encounter

## 2024-08-10 VITALS — BP 130/83 | HR 65 | Temp 98.9°F | Resp 18

## 2024-08-10 DIAGNOSIS — S29011A Strain of muscle and tendon of front wall of thorax, initial encounter: Secondary | ICD-10-CM

## 2024-08-10 MED ORDER — TIZANIDINE HCL 4 MG PO TABS: 4.0000 mg | ORAL_TABLET | Freq: Three times a day (TID) | ORAL | 0 refills | Status: AC | PRN

## 2024-08-10 NOTE — Discharge Instructions (Signed)
 The x-ray was negative for a rib fracture or dislocation. Take medication as prescribed. Recommend over-the-counter Tylenol  as needed for pain or discomfort. Recommend the use of ice or heat.  Apply ice for pain or swelling, heat for spasm or stiffness.  Apply for 20 minutes, remove for 1 hour, repeat as needed. Gentle stretching and range of motion exercises while symptoms persist. If symptoms fail to improve over the next 2 to 4 weeks, recommend follow-up with your primary care physician for further evaluation. Go to the emergency department if you experience worsening chest wall pain, shortness of breath, difficulty breathing, or other concerns. Follow-up as needed.

## 2024-08-10 NOTE — ED Provider Notes (Signed)
 " [facility]    CSN: [id number] Arrival date & time: 08/10/24  1247      History   Chief Complaint Chief Complaint  Patient presents with   Rib Injury    Lifting onto truck tailgate I felt a pull/pain in what seems to be my first rib; under my left breast. Is not getting better, at night difficulty sleeping on side and coughing is painful. - Entered by patient    HPI Heather Willis is a 69 y.o. female.   The history is provided by the patient.   Patient presents for complaints of pain under the left breast.  Patient states symptoms started approximately 2 weeks ago after she popped up on the bed of a truck to pull something off of the truck.  States at that time, she did feel a pulled/pop under the left breast.  She states that the injury did not stop her at that time.  She states over the past 2 weeks, she has a sharp pain immediately under the left breast.  She states she has not noticed any bruising, swelling, redness, she further denies chest pain, difficulty breathing, or wheezing.  She states pain worsens when she coughs, deep breathes, when she lies on her left side.  States that the injury has not changed her activities.  So far, states she has not taken any medications for her symptoms.  Past Medical History:  Diagnosis Date   Allergy    Arthritis    Closed left ankle fracture 2018   Fracture of left ankle, lateral malleolus 04/07/2017   Fractures    Leg wound, left, subsequent encounter 03/26/2022   Psoriatic arthritis [facility])     Patient Active Problem List   Diagnosis Date Noted   Vitamin D  deficiency 07/28/2024   History of psoriatic arthritis 03/26/2023   Stress due to illness of family member 03/26/2023   Prediabetes 09/25/2022   Hyperlipidemia 06/27/2022   Osteopenia 02/21/2022   Abnormal mammogram 02/21/2022   Obesity (BMI 30-39.9) 01/24/2022   Post-menopausal 01/24/2022   Encounter for screening mammogram for malignant neoplasm of breast  01/24/2022   Screening for colon cancer 01/24/2022    Past Surgical History:  Procedure Laterality Date   ABDOMINAL HERNIA REPAIR     18 yrs ago   ABDOMINAL HYSTERECTOMY     BREAST BIOPSY Left 2005   benign   CHOLECYSTECTOMY     HYSTERECTOMY ABDOMINAL WITH SALPINGECTOMY     at age 63   ORIF ANKLE FRACTURE Left 04/09/2017   Procedure: OPEN REDUCTION INTERNAL FIXATION (ORIF) LEFT ANKLE FRACTURE;  Surgeon: Jerri Kay HERO, MD;  Location: [facility];  Service: Orthopedics;  Laterality: Left;   REDUCTION MAMMAPLASTY  1990    OB History   No obstetric history on file.      Home Medications    Prior to Admission medications  Medication Sig Start Date End Date Taking? Authorizing Provider  tiZANidine  (ZANAFLEX ) 4 MG tablet Take 1 tablet (4 mg total) by mouth every 8 (eight) hours as needed. 08/10/24  Yes Leath-Warren, Etta PARAS, NP  Calcium  Carb-Cholecalciferol  (CALTRATE 600+D3) 600-20 MG-MCG TABS Take 600 mg by mouth 2 (two) times daily. 02/07/22   Paseda, Folashade R, FNP    Family History Family History  Problem Relation Age of Onset   Parkinsonism Mother    Throat cancer Father    Atrial fibrillation Father    Heart disease Father    Macular degeneration Father    Diabetes  Sister    Atrial fibrillation Sister    Heart disease Sister    Obesity Sister    Pancreatic cancer Sister    Breast cancer Neg Hx    Colon cancer Neg Hx     Social History Social History[1]   Allergies   Penicillins   Review of Systems Review of Systems Per HPI  Physical Exam Triage Vital Signs ED Triage Vitals  Encounter Vitals Group     BP 08/10/24 1301 130/83     Girls Systolic BP Percentile --      Girls Diastolic BP Percentile --      Boys Systolic BP Percentile --      Boys Diastolic BP Percentile --      Pulse Rate 08/10/24 1301 65     Resp 08/10/24 1301 18     Temp 08/10/24 1301 98.9 F (37.2 C)     Temp Source 08/10/24 1301 Oral     SpO2 08/10/24 1301 94  %     Weight --      Height --      Head Circumference --      Peak Flow --      Pain Score 08/10/24 1304 2     Pain Loc --      Pain Education --      Exclude from Growth Chart --    No data found.  Updated Vital Signs BP 130/83 (BP Location: Right Arm)   Pulse 65   Temp 98.9 F (37.2 C) (Oral)   Resp 18   SpO2 94%   Visual Acuity Right Eye Distance:   Left Eye Distance:   Bilateral Distance:    Right Eye Near:   Left Eye Near:    Bilateral Near:     Physical Exam Vitals and nursing note reviewed.  Constitutional:      General: She is not in acute distress.    Appearance: Normal appearance.  HENT:     Head: Normocephalic.  Eyes:     Extraocular Movements: Extraocular movements intact.     Pupils: Pupils are equal, round, and reactive to light.  Cardiovascular:     Rate and Rhythm: Normal rate and regular rhythm.     Pulses: Normal pulses.     Heart sounds: Normal heart sounds.  Pulmonary:     Effort: Pulmonary effort is normal. No respiratory distress.     Breath sounds: Normal breath sounds. No stridor. No wheezing, rhonchi or rales.  Chest:     Chest wall: Tenderness present. No deformity or swelling.  Breasts:    Left: No swelling or tenderness.    Abdominal:     General: Bowel sounds are normal.     Palpations: Abdomen is soft.  Musculoskeletal:     Cervical back: Normal range of motion.  Skin:    General: Skin is warm and dry.  Neurological:     General: No focal deficit present.     Mental Status: She is alert and oriented to person, place, and time.  Psychiatric:        Mood and Affect: Mood normal.        Behavior: Behavior normal.      UC Treatments / Results  Labs (all labs ordered are listed, but only abnormal results are displayed) Labs Reviewed - No data to display  EKG   Radiology DG Chest 2 View Result Date: 08/10/2024 CLINICAL DATA:  Rib injury chest pain worse on the left EXAM: CHEST - 2  VIEW COMPARISON:  None Available.  FINDINGS: The heart size and mediastinal contours are within normal limits. Both lungs are clear. The visualized skeletal structures are unremarkable. IMPRESSION: No active cardiopulmonary disease. Electronically Signed   By: CHRISTELLA.  Shick M.D.   On: 08/10/2024 13:21    Procedures Procedures (including critical care time)  Medications Ordered in UC Medications - No data to display  Initial Impression / Assessment and Plan / UC Course  I have reviewed the triage vital signs and the nursing notes.  Pertinent labs & imaging results that were available during my care of the patient were reviewed by me and considered in my medical decision making (see chart for details).  The chest x-ray was negative for rib fracture or dislocation.  Symptoms are consistent with a chest wall strain most likely of muscular etiology.  Will treat with tizanidine  4 mg.  Supportive care recommendations were provided and discussed with the patient to include over-the-counter analgesics and the use of ice or heat.  Discussed indications with the patient regarding follow-up.  Patient was in agreement with this plan of care and verbalizes understanding.  All questions were answered.  Patient stable for discharge.  Final Clinical Impressions(s) / UC Diagnoses   Final diagnoses:  Chest wall muscle strain, initial encounter     Discharge Instructions      The x-ray was negative for a rib fracture or dislocation. Take medication as prescribed. Recommend over-the-counter Tylenol  as needed for pain or discomfort. Recommend the use of ice or heat.  Apply ice for pain or swelling, heat for spasm or stiffness.  Apply for 20 minutes, remove for 1 hour, repeat as needed. Gentle stretching and range of motion exercises while symptoms persist. If symptoms fail to improve over the next 2 to 4 weeks, recommend follow-up with your primary care physician for further evaluation. Go to the emergency department if you experience worsening  chest wall pain, shortness of breath, difficulty breathing, or other concerns. Follow-up as needed.     ED Prescriptions     Medication Sig Dispense Auth. Provider   tiZANidine  (ZANAFLEX ) 4 MG tablet Take 1 tablet (4 mg total) by mouth every 8 (eight) hours as needed. 20 tablet Leath-Warren, Etta PARAS, NP      PDMP not reviewed this encounter.    [1]  Social History Tobacco Use   Smoking status: Former    Current packs/day: 0.00    Types: Cigarettes    Quit date: 1998    Years since quitting: 28.0   Smokeless tobacco: Never  Substance Use Topics   Alcohol use: Never   Drug use: Never     Gilmer Etta PARAS, NP 08/10/24 1327  "

## 2024-08-10 NOTE — ED Triage Notes (Signed)
 Pt reports injury  to the chest on the left states a few weeks ago she pulled herself up on a tailgate and felt a ripping in her left side under her breast and now feels a sharp pain on the left side.

## 2024-08-20 ENCOUNTER — Other Ambulatory Visit (HOSPITAL_COMMUNITY): Payer: Self-pay

## 2025-01-25 ENCOUNTER — Ambulatory Visit: Payer: Self-pay

## 2025-07-01 ENCOUNTER — Ambulatory Visit
